# Patient Record
Sex: Male | Born: 1975 | Race: White | Hispanic: No | Smoking: Former smoker
Health system: Southern US, Community
[De-identification: ages and names within clinical notes are randomized; demographics above are authoritative.]

## PROBLEM LIST (undated history)

## (undated) HISTORY — PX: CERVICAL SPINE SURGERY: SHX589

---

## 2000-10-05 ENCOUNTER — Ambulatory Visit (HOSPITAL_COMMUNITY): Admission: RE | Admit: 2000-10-05 | Discharge: 2000-10-05 | Payer: Self-pay | Admitting: Family Medicine

## 2000-10-05 ENCOUNTER — Encounter: Payer: Self-pay | Admitting: Family Medicine

## 2002-09-23 ENCOUNTER — Ambulatory Visit (HOSPITAL_COMMUNITY): Admission: RE | Admit: 2002-09-23 | Discharge: 2002-09-23 | Payer: Self-pay | Admitting: Family Medicine

## 2002-09-23 ENCOUNTER — Encounter: Payer: Self-pay | Admitting: Family Medicine

## 2004-02-11 ENCOUNTER — Emergency Department (HOSPITAL_COMMUNITY): Admission: EM | Admit: 2004-02-11 | Discharge: 2004-02-11 | Payer: Self-pay | Admitting: Emergency Medicine

## 2005-03-28 ENCOUNTER — Ambulatory Visit (HOSPITAL_COMMUNITY): Admission: RE | Admit: 2005-03-28 | Discharge: 2005-03-28 | Payer: Self-pay | Admitting: Family Medicine

## 2005-05-12 ENCOUNTER — Ambulatory Visit (HOSPITAL_COMMUNITY): Admission: RE | Admit: 2005-05-12 | Discharge: 2005-05-12 | Payer: Self-pay | Admitting: Family Medicine

## 2005-05-14 ENCOUNTER — Ambulatory Visit (HOSPITAL_COMMUNITY): Admission: RE | Admit: 2005-05-14 | Discharge: 2005-05-14 | Payer: Self-pay | Admitting: Family Medicine

## 2005-05-29 ENCOUNTER — Ambulatory Visit (HOSPITAL_COMMUNITY): Admission: RE | Admit: 2005-05-29 | Discharge: 2005-05-29 | Payer: Self-pay | Admitting: Family Medicine

## 2005-06-12 ENCOUNTER — Ambulatory Visit: Payer: Self-pay | Admitting: Orthopedic Surgery

## 2006-11-27 ENCOUNTER — Ambulatory Visit (HOSPITAL_COMMUNITY): Admission: RE | Admit: 2006-11-27 | Discharge: 2006-11-27 | Payer: Self-pay | Admitting: Family Medicine

## 2007-05-17 ENCOUNTER — Ambulatory Visit (HOSPITAL_COMMUNITY): Admission: RE | Admit: 2007-05-17 | Discharge: 2007-05-17 | Payer: Self-pay | Admitting: Family Medicine

## 2010-08-11 ENCOUNTER — Emergency Department (HOSPITAL_COMMUNITY)
Admission: EM | Admit: 2010-08-11 | Discharge: 2010-08-11 | Disposition: A | Discharge status: Home / Self Care | Payer: 59 | Attending: Emergency Medicine | Admitting: Emergency Medicine

## 2010-08-11 DIAGNOSIS — Y92009 Unspecified place in unspecified non-institutional (private) residence as the place of occurrence of the external cause: Secondary | ICD-10-CM | POA: Insufficient documentation

## 2010-08-11 DIAGNOSIS — T1590XA Foreign body on external eye, part unspecified, unspecified eye, initial encounter: Secondary | ICD-10-CM | POA: Insufficient documentation

## 2010-08-11 DIAGNOSIS — H5710 Ocular pain, unspecified eye: Secondary | ICD-10-CM | POA: Insufficient documentation

## 2010-08-11 DIAGNOSIS — H538 Other visual disturbances: Secondary | ICD-10-CM | POA: Insufficient documentation

## 2010-08-11 DIAGNOSIS — S0510XA Contusion of eyeball and orbital tissues, unspecified eye, initial encounter: Secondary | ICD-10-CM | POA: Insufficient documentation

## 2010-08-11 DIAGNOSIS — H5789 Other specified disorders of eye and adnexa: Secondary | ICD-10-CM | POA: Insufficient documentation

## 2014-05-23 ENCOUNTER — Encounter: Payer: Self-pay | Admitting: Family Medicine

## 2014-05-23 ENCOUNTER — Ambulatory Visit (INDEPENDENT_AMBULATORY_CARE_PROVIDER_SITE_OTHER): Payer: BC Managed Care – PPO | Admitting: Family Medicine

## 2014-05-23 VITALS — BP 136/78 | Ht 72.0 in | Wt 157.8 lb

## 2014-05-23 DIAGNOSIS — R208 Other disturbances of skin sensation: Secondary | ICD-10-CM

## 2014-05-23 DIAGNOSIS — M4712 Other spondylosis with myelopathy, cervical region: Secondary | ICD-10-CM

## 2014-05-23 DIAGNOSIS — R200 Anesthesia of skin: Secondary | ICD-10-CM

## 2014-05-23 DIAGNOSIS — R29898 Other symptoms and signs involving the musculoskeletal system: Secondary | ICD-10-CM

## 2014-05-23 MED ORDER — PREDNISONE 20 MG PO TABS: ORAL_TABLET | ORAL | Status: DC

## 2014-05-23 MED ORDER — OXYCODONE-ACETAMINOPHEN 5-325 MG PO TABS: 1.0000 | ORAL_TABLET | ORAL | Status: DC | PRN

## 2014-05-23 NOTE — Progress Notes (Signed)
   Subjective:    Patient ID: Andrew Fernandez, male    DOB: [birth year], 38 y.o.   MRN: [id number]  HPI  Patient arrives with complaint of flare of neck pain for one week. Patient also having right hip pain. Patient relates severe neck pain present for the past week present into the trapezius down into the right arm he also relates bilateral numbness of the hands He also relates low back pain with right hip pain as well as left leg weakness and foot drop causing difficulty when he walks  He denies any injury denies previous history of this progressive nature he is also relates difficulty with function of his hands and coordination. Review of Systems See above    Objective:   Physical Exam trapezius pain on the right side patient rates pain as 10+ Reflexes arms and legs are good diminished in the left leg Patient has significant weakness with foot drop and difficulty with eversion ankle movement. Has weakness significant. Slight quadriceps weakness Negative straight leg raise Complains of bilateral hand numbness. 25 minutes spent with patient    Assessment & Plan:  [phone]-patient's phone number Consultation over the phone was held with neurologist. Discussing the aspects of his case it is clear that he has impingement within the neck was seen significant pain discomfort down the right arm plus also could be contributed to bilateral hand numbness as well as weakness of the left leg.  Because of the dramatic nature of the changes it is recommended to pursue for with MRI. Patient does not want MRI until Monday. Prednisone taper. Percocet for pain caution drowsiness home use only  Recommend MRI of the cervical spine we will do that this coming Monday. May need neurology referral for EMG.

## 2014-05-24 ENCOUNTER — Telehealth: Payer: Self-pay | Admitting: Family Medicine

## 2014-05-24 MED ORDER — OXYCODONE-ACETAMINOPHEN 10-325 MG PO TABS: 1.0000 | ORAL_TABLET | ORAL | Status: DC | PRN

## 2014-05-24 NOTE — Telephone Encounter (Signed)
Percocet 10 mg/325 one every 4 hours when necessary pain, #60. Do not combine it with the 5 mg.

## 2014-05-24 NOTE — Telephone Encounter (Signed)
Patient was seen yesterday for neck pain.  He says that the prednisone that he was prescribed yesterday does not seem to be working.  He says that between yesterday and today, he has taken 5 with no help. Please advise.  Welda

## 2014-05-24 NOTE — Telephone Encounter (Signed)
Left VM stating we have the medication waiting up front to be picked up.

## 2014-05-24 NOTE — Telephone Encounter (Signed)
NTC- discuss with pt, this message makes no sense, prednisone will reduce inflammation, percocet for pain , please figure out whats up

## 2014-05-24 NOTE — Telephone Encounter (Signed)
Patient states he meant to say Percocet not prednisone. He states the 5/325 is not working and he wants the 10/325. Please call him at [phone] when script is ready for pickup.

## 2014-05-26 ENCOUNTER — Other Ambulatory Visit: Payer: Self-pay

## 2014-05-26 DIAGNOSIS — M542 Cervicalgia: Secondary | ICD-10-CM

## 2014-05-29 ENCOUNTER — Other Ambulatory Visit (HOSPITAL_COMMUNITY): Payer: 59

## 2014-05-29 ENCOUNTER — Ambulatory Visit (HOSPITAL_COMMUNITY)
Admission: RE | Admit: 2014-05-29 | Discharge: 2014-05-29 | Disposition: A | Discharge status: Home / Self Care | Payer: BC Managed Care – PPO | Source: Ambulatory Visit | Attending: Family Medicine | Admitting: Family Medicine

## 2014-05-29 DIAGNOSIS — R200 Anesthesia of skin: Secondary | ICD-10-CM | POA: Diagnosis not present

## 2014-05-29 DIAGNOSIS — M542 Cervicalgia: Secondary | ICD-10-CM | POA: Diagnosis present

## 2014-05-29 DIAGNOSIS — M4802 Spinal stenosis, cervical region: Secondary | ICD-10-CM | POA: Diagnosis not present

## 2014-05-29 DIAGNOSIS — M5022 Other cervical disc displacement, mid-cervical region: Secondary | ICD-10-CM | POA: Insufficient documentation

## 2014-05-30 ENCOUNTER — Telehealth: Payer: Self-pay | Admitting: *Deleted

## 2014-05-30 DIAGNOSIS — M542 Cervicalgia: Secondary | ICD-10-CM

## 2014-05-30 NOTE — Telephone Encounter (Signed)
Dr. Nicki Reaper would like to speak to patient regarding his MRI results.

## 2014-05-30 NOTE — Telephone Encounter (Signed)
Pt was spoken with, will see specialist in the am

## 2014-05-30 NOTE — Addendum Note (Signed)
Addended by: Dairl Ponder on: 05/30/2014 02:06 PM   Modules accepted: Orders

## 2014-06-01 ENCOUNTER — Telehealth: Payer: Self-pay | Admitting: Family Medicine

## 2014-06-01 NOTE — Telephone Encounter (Signed)
Thank you :)

## 2014-06-01 NOTE — Telephone Encounter (Signed)
FYI - Patient called to let us know he was seen today by Dr. Carloyn Manner (neurosurgeon in [place]) and his neck surgery is scheduled for 07/10/2014

## 2014-06-09 ENCOUNTER — Other Ambulatory Visit: Payer: Self-pay | Admitting: Family Medicine

## 2014-06-09 MED ORDER — OXYCODONE-ACETAMINOPHEN 10-325 MG PO TABS: 1.0000 | ORAL_TABLET | ORAL | Status: DC | PRN

## 2014-06-09 NOTE — Telephone Encounter (Signed)
Patient needs Rx for oxyCODONE-acetaminophen (PERCOCET) 10-325 MG per tablet

## 2014-06-09 NOTE — Telephone Encounter (Signed)
Please give the patient a refill on this medicine

## 2014-06-09 NOTE — Telephone Encounter (Signed)
Last seen 05/23/14

## 2014-06-09 NOTE — Telephone Encounter (Signed)
Left message on voicemail of cell # that script is ready for pickup.

## 2014-07-04 ENCOUNTER — Other Ambulatory Visit: Payer: Self-pay | Admitting: Family Medicine

## 2014-08-10 ENCOUNTER — Other Ambulatory Visit: Payer: Self-pay | Admitting: Family Medicine

## 2014-08-10 NOTE — Telephone Encounter (Signed)
Last seen 05/23/14

## 2015-01-08 ENCOUNTER — Encounter: Payer: Self-pay | Admitting: Family Medicine

## 2015-01-08 ENCOUNTER — Ambulatory Visit (INDEPENDENT_AMBULATORY_CARE_PROVIDER_SITE_OTHER): Payer: BLUE CROSS/BLUE SHIELD | Admitting: Family Medicine

## 2015-01-08 VITALS — BP 112/74 | Ht 72.0 in | Wt 160.0 lb

## 2015-01-08 DIAGNOSIS — N522 Drug-induced erectile dysfunction: Secondary | ICD-10-CM

## 2015-01-08 DIAGNOSIS — F411 Generalized anxiety disorder: Secondary | ICD-10-CM

## 2015-01-08 MED ORDER — SILDENAFIL CITRATE 100 MG PO TABS: 50.0000 mg | ORAL_TABLET | Freq: Every day | ORAL | Status: DC | PRN

## 2015-01-08 MED ORDER — CITALOPRAM HYDROBROMIDE 20 MG PO TABS: ORAL_TABLET | ORAL | Status: DC

## 2015-01-08 NOTE — Progress Notes (Signed)
   Subjective:    Patient ID: Andrew Fernandez, male    DOB: 03/31/1976, 39 y.o.   MRN: [id number]  HPIpt ran out of celexa 20mg . Pt was taking one half tablet instead of one to make them last longer. Has been out of med for the past month. Pt wanting to start back on celexa.  Pt concerned about med causing ED.  He does state that the medication causes erectile dysfunction. When he stops taking the medicine he has trouble with feeling anxious   Review of Systems He denies being depressed denies being suicidal states energy level doing well    Objective:   Physical Exam  Lungs clear hearts regular pulse normal BP good      Assessment & Plan:  Erectile dysfunction-prescription for Viagra given. If he cannot afford that then generic sildenafil through [place] can be done patient will let us know  Generalized anxiety does well with citalopram continue this. 20 mg daily. We did discuss other options but other options have increased risk of side effects for which he states he does not one to do currently  As long as patient does good and has no setbacks follow-up in one year, the importance of keeping healthy exercising watching diet and checking cholesterol discussed. This apparently is done through the [place]

## 2015-01-10 ENCOUNTER — Telehealth: Payer: Self-pay | Admitting: Family Medicine

## 2015-01-10 NOTE — Telephone Encounter (Signed)
Please print a prescription for sildenafil 20 mg, #50, 6 refills, instructions-take 2 to 5 capsules before relations prn, I will sign the prescription and give you a flyer to go with the prescription for the patient to pick up

## 2015-01-10 NOTE — Telephone Encounter (Signed)
Patient says the sildenafil (VIAGRA) 100 MG tablet is too pricey and wants the information to the place in [place] that Dr. Nicki Reaper mentioned.

## 2015-01-11 MED ORDER — SILDENAFIL CITRATE 20 MG PO TABS: ORAL_TABLET | ORAL | Status: DC

## 2015-01-11 NOTE — Telephone Encounter (Signed)
Spoke with patient and informed him that prescription and requested information was ready for pickup. Patient verbalized understanding.

## 2015-01-11 NOTE — Telephone Encounter (Signed)
Prescription printed and ready for pick up with flyer to place in [place] that patient requested.

## 2016-04-03 ENCOUNTER — Ambulatory Visit (INDEPENDENT_AMBULATORY_CARE_PROVIDER_SITE_OTHER): Payer: BLUE CROSS/BLUE SHIELD | Admitting: Nurse Practitioner

## 2016-04-03 ENCOUNTER — Encounter: Payer: Self-pay | Admitting: Nurse Practitioner

## 2016-04-03 VITALS — BP 128/82 | Ht 72.0 in | Wt 157.0 lb

## 2016-04-03 DIAGNOSIS — R7309 Other abnormal glucose: Secondary | ICD-10-CM

## 2016-04-03 LAB — POCT GLYCOSYLATED HEMOGLOBIN (HGB A1C): HEMOGLOBIN A1C: 5.5

## 2016-04-04 ENCOUNTER — Encounter: Payer: Self-pay | Admitting: Nurse Practitioner

## 2016-04-04 NOTE — Progress Notes (Signed)
Subjective:  Presents for a BP check. Blood pressure was elevated on his recent wellness exam at work. Does have some sodium in his diet since he eats out frequently. No edema. No chest pain/ischemic type pain or shortness of breath. Smokes one and a half pack per day. No oral tobacco use. [facility] his father has hypertension.  Objective:   BP 128/82   Ht 6' (1.829 m)   Wt 157 lb (71.2 kg)   BMI 21.29 kg/m  NAD. Alert, oriented. Lungs clear. Heart regular rate rhythm. No murmur or gallop noted. Carotids no bruits or thrills. BP on recheck left arm sitting 118/88 and right arm 120/82. Lower extremities no edema. Nonfasting blood sugar on his recent lab work was 120. Results for orders placed or performed in visit on 04/03/16  POCT glycosylated hemoglobin (Hb A1C)  Result Value Ref Range   Hemoglobin A1C 5.5      Assessment: Elevated glucose - Plan: POCT glycosylated hemoglobin (Hb A1C)  Plan: No medication indicated at this time. Also A1c is normal. Strongly recommend patient begin cutting back on smoking. Our goal is for him to get down to no more than 5 cigarettes per day eventually, ideally for him to quit smoking. Discussed the effect of smoking on his blood pressure. Encouraged low-sodium diet. Recheck here if further problems.

## 2016-06-26 ENCOUNTER — Ambulatory Visit (INDEPENDENT_AMBULATORY_CARE_PROVIDER_SITE_OTHER): Payer: BLUE CROSS/BLUE SHIELD | Admitting: Nurse Practitioner

## 2016-06-26 ENCOUNTER — Encounter: Payer: Self-pay | Admitting: Nurse Practitioner

## 2016-06-26 VITALS — BP 132/86 | Ht 72.0 in | Wt 161.0 lb

## 2016-06-26 DIAGNOSIS — R030 Elevated blood-pressure reading, without diagnosis of hypertension: Secondary | ICD-10-CM | POA: Diagnosis not present

## 2016-06-26 NOTE — Progress Notes (Signed)
Subjective:  Presents to discuss his recent health risk assessment survey done through employer for his insurance. Currently smokes 1 1/2 ppd.   Objective:   BP 132/86   Ht 6' (1.829 m)   Wt 161 lb (73 kg)   BMI 21.84 kg/m  NAD. Alert, oriented. Lungs clear. Heart RRR. Paperwork today shows BP 158/91 and HDL 25. Note that his labs are non fasting.   Assessment: Elevated blood pressure reading  Plan: reduce or stop smoking; goal is for him to cut back to 1/2 ppd over time; recheck BP through [facility] every 3 months as required by insurance; call back if BP is consistently over 140/90. Explained that glucose, TG and LDL are not accurate since non fasting results. Follow up here as needed. No medication indicated at this time.

## 2019-11-16 ENCOUNTER — Encounter: Payer: Self-pay | Admitting: Family Medicine

## 2019-11-16 ENCOUNTER — Other Ambulatory Visit: Payer: Self-pay

## 2019-11-16 ENCOUNTER — Telehealth (INDEPENDENT_AMBULATORY_CARE_PROVIDER_SITE_OTHER): Payer: Commercial Managed Care - PPO | Admitting: Family Medicine

## 2019-11-16 ENCOUNTER — Telehealth: Payer: Self-pay | Admitting: *Deleted

## 2019-11-16 VITALS — Ht 73.0 in

## 2019-11-16 DIAGNOSIS — Z87891 Personal history of nicotine dependence: Secondary | ICD-10-CM

## 2019-11-16 DIAGNOSIS — N529 Male erectile dysfunction, unspecified: Secondary | ICD-10-CM

## 2019-11-16 HISTORY — DX: Personal history of nicotine dependence: Z87.891

## 2019-11-16 HISTORY — DX: Male erectile dysfunction, unspecified: N52.9

## 2019-11-16 MED ORDER — SILDENAFIL CITRATE 20 MG PO TABS: ORAL_TABLET | ORAL | 6 refills | Status: DC

## 2019-11-16 NOTE — Progress Notes (Signed)
   Subjective:    Patient ID: Andrew Fernandez, male    DOB: [birth year], 44 y.o.   MRN: [id number]  HPI  Pt needing follow up. Pt last seen in 2018. Pt states he is trying to do better by quitting smoking and drinking water Patient doing a good job eating healthy staying active.  Losing weight.  Quitting smoking. Has erectile dysfunction would like refills of sildenafil. We did discuss the importance of wellness health checkups No family history of prostate cancer or rectal cancer colonoscopy not indicated currently   Virtual Visit via Video Note  I connected with Andrew Fernandez on 11/16/19 at  1:10 PM EDT by a video enabled telemedicine application and verified that I am speaking with the correct person using two identifiers.  Location: Patient: home Provider: office   I discussed the limitations of evaluation and management by telemedicine and the availability of in person appointments. The patient expressed understanding and agreed to proceed.  History of Present Illness:    Observations/Objective:   Assessment and Plan:   Follow Up Instructions:    I discussed the assessment and treatment plan with the patient. The patient was provided an opportunity to ask questions and all were answered. The patient agreed with the plan and demonstrated an understanding of the instructions.   The patient was advised to call back or seek an in-person evaluation if the symptoms worsen or if the condition fails to improve as anticipated.  I provided 20 minutes of non-face-to-face time during this encounter.   Review of Systems     Objective:   Physical Exam   Today's visit was via telephone Physical exam was not possible for this visit And statin     Assessment & Plan:  We did discuss colonoscopy for age 56 he will look into see if his insurance company covers it  Also screening lipid at med [facility]  Patient quit smoking  I do recommend a wellness exam later this  year  Erectile dysfunction renewal of his prescription was sent he states the sildenafil does a good job of keeping his erectile dysfunction working better

## 2019-11-16 NOTE — Telephone Encounter (Signed)
Mr. reshard, kittelson are scheduled for a virtual visit with your provider today.    Just as we do with appointments in the office, we must obtain your consent to participate.  Your consent will be active for this visit and any virtual visit you may have with one of our providers in the next 365 days.    If you have a MyChart account, I can also send a copy of this consent to you electronically.  All virtual visits are billed to your insurance company just like a traditional visit in the office.  As this is a virtual visit, video technology does not allow for your provider to perform a traditional examination.  This may limit your provider's ability to fully assess your condition.  If your provider identifies any concerns that need to be evaluated in person or the need to arrange testing such as labs, EKG, etc, we will make arrangements to do so.    Although advances in technology are sophisticated, we cannot ensure that it will always work on either your end or our end.  If the connection with a video visit is poor, we may have to switch to a telephone visit.  With either a video or telephone visit, we are not always able to ensure that we have a secure connection.   I need to obtain your verbal consent now.   Are you willing to proceed with your visit today?   XYON DAFFERN has provided verbal consent on 11/16/2019 for a virtual visit (video or telephone).   Patsy Lager, LPN [id number]  [id number] AM

## 2020-01-05 ENCOUNTER — Other Ambulatory Visit (HOSPITAL_COMMUNITY): Payer: Self-pay | Admitting: Neurosurgery

## 2020-01-05 ENCOUNTER — Other Ambulatory Visit: Payer: Self-pay | Admitting: Neurosurgery

## 2020-01-05 DIAGNOSIS — M5412 Radiculopathy, cervical region: Secondary | ICD-10-CM

## 2020-01-16 ENCOUNTER — Ambulatory Visit (HOSPITAL_COMMUNITY)
Admission: RE | Admit: 2020-01-16 | Discharge: 2020-01-16 | Disposition: A | Discharge status: Home / Self Care | Payer: Commercial Managed Care - PPO | Source: Ambulatory Visit | Attending: Neurosurgery | Admitting: Neurosurgery

## 2020-01-16 ENCOUNTER — Other Ambulatory Visit: Payer: Self-pay

## 2020-01-16 DIAGNOSIS — M5412 Radiculopathy, cervical region: Secondary | ICD-10-CM | POA: Insufficient documentation

## 2020-11-23 ENCOUNTER — Other Ambulatory Visit: Payer: Self-pay | Admitting: Family Medicine

## 2020-11-24 NOTE — Telephone Encounter (Signed)
May have this +1 refill needs a follow-up visit in the summer

## 2020-12-31 ENCOUNTER — Ambulatory Visit (INDEPENDENT_AMBULATORY_CARE_PROVIDER_SITE_OTHER): Payer: Commercial Managed Care - PPO | Admitting: Family Medicine

## 2020-12-31 ENCOUNTER — Other Ambulatory Visit: Payer: Self-pay

## 2020-12-31 VITALS — BP 124/87 | HR 88 | Temp 98.1°F | Ht 73.0 in | Wt 163.0 lb

## 2020-12-31 DIAGNOSIS — Z1211 Encounter for screening for malignant neoplasm of colon: Secondary | ICD-10-CM

## 2020-12-31 DIAGNOSIS — E785 Hyperlipidemia, unspecified: Secondary | ICD-10-CM

## 2020-12-31 DIAGNOSIS — Z0001 Encounter for general adult medical examination with abnormal findings: Secondary | ICD-10-CM

## 2020-12-31 DIAGNOSIS — Z0000 Encounter for general adult medical examination without abnormal findings: Secondary | ICD-10-CM

## 2020-12-31 MED ORDER — TADALAFIL 20 MG PO TABS: ORAL_TABLET | ORAL | 3 refills | Status: DC
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
## 2020-12-31 NOTE — Patient Instructions (Signed)
High Cholesterol  High cholesterol is a condition in which the blood has high levels of a white, waxy substance similar to fat (cholesterol). The liver makes all the cholesterol that the body needs. The human body needs small amounts of cholesterol to help build cells. A person gets extra orexcess cholesterol from the food that he or she eats. The blood carries cholesterol from the liver to the rest of the body. If you have high cholesterol, deposits (plaques) may build up on the walls of your arteries. Arteries are the blood vessels that carry blood away from your heart. These plaques make the arteries narrowand stiff. Cholesterol plaques increase your risk for heart attack and stroke. Work withyour health care provider to keep your cholesterol levels in a healthy range. What increases the risk? The following factors may make you more likely to develop this condition: Eating foods that are high in animal fat (saturated fat) or cholesterol. Being overweight. Not getting enough exercise. A family history of high cholesterol (familial hypercholesterolemia). Use of tobacco products. Having diabetes. What are the signs or symptoms? There are no symptoms of this condition. How is this diagnosed? This condition may be diagnosed based on the results of a blood test. If you are older than 46 years of age, your health care provider may check your cholesterol levels every 4-6 years. You may be checked more often if you have high cholesterol or other risk factors for heart disease. The blood test for cholesterol measures: "Bad" cholesterol, or LDL cholesterol. This is the main type of cholesterol that causes heart disease. The desired level is less than 100 mg/dL. "Good" cholesterol, or HDL cholesterol. HDL helps protect against heart disease by cleaning the arteries and carrying the LDL to the liver for processing. The desired level for HDL is 60 mg/dL or higher. Triglycerides. These are fats that your  body can store or burn for energy. The desired level is less than 150 mg/dL. Total cholesterol. This measures the total amount of cholesterol in your blood and includes LDL, HDL, and triglycerides. The desired level is less than 200 mg/dL. How is this treated? This condition may be treated with: Diet changes. You may be asked to eat foods that have more fiber and less saturated fats or added sugar. Lifestyle changes. These may include regular exercise, maintaining a healthy weight, and quitting use of tobacco products. Medicines. These are given when diet and lifestyle changes have not worked. You may be prescribed a statin medicine to help lower your cholesterol levels. Follow these instructions at home: Eating and drinking  Eat a healthy, balanced diet. This diet includes: Daily servings of a variety of fresh, frozen, or canned fruits and vegetables. Daily servings of whole grain foods that are rich in fiber. Foods that are low in saturated fats and trans fats. These include poultry and fish without skin, lean cuts of meat, and low-fat dairy products. A variety of fish, especially oily fish that contain omega-3 fatty acids. Aim to eat fish at least 2 times a week. Avoid foods and drinks that have added sugar. Use healthy cooking methods, such as roasting, grilling, broiling, baking, poaching, steaming, and stir-frying. Do not fry your food except for stir-frying.  Lifestyle  Get regular exercise. Aim to exercise for a total of 150 minutes a week. Increase your activity level by doing activities such as gardening, walking, and taking the stairs. Do not use any products that contain nicotine or tobacco, such as cigarettes, e-cigarettes, and chewing tobacco.   If you need help quitting, ask your health care provider.  General instructions Take over-the-counter and prescription medicines only as told by your health care provider. Keep all follow-up visits as told by your health care provider.  This is important. Where to find more information American Heart Association: www.heart.org National Heart, Lung, and Blood Institute: https://wilson-eaton.com/ Contact a health care provider if: You have trouble achieving or maintaining a healthy diet or weight. You are starting an exercise program. You are unable to stop smoking. Get help right away if: You have chest pain. You have trouble breathing. You have any symptoms of a stroke. "BE FAST" is an easy way to remember the main warning signs of a stroke: B - Balance. Signs are dizziness, sudden trouble walking, or loss of balance. E - Eyes. Signs are trouble seeing or a sudden change in vision. F - Face. Signs are sudden weakness or numbness of the face, or the face or eyelid drooping on one side. A - Arms. Signs are weakness or numbness in an arm. This happens suddenly and usually on one side of the body. S - Speech. Signs are sudden trouble speaking, slurred speech, or trouble understanding what people say. T - Time. Time to call emergency services. Write down what time symptoms started. You have other signs of a stroke, such as: A sudden, severe headache with no known cause. Nausea or vomiting. Seizure. These symptoms may represent a serious problem that is an emergency. Do not wait to see if the symptoms will go away. Get medical help right away. Call your local emergency services (911 in the [place] Do not drive yourself to the hospital. Summary Cholesterol plaques increase your risk for heart attack and stroke. Work with your health care provider to keep your cholesterol levels in a healthy range. Eat a healthy, balanced diet, get regular exercise, and maintain a healthy weight. Do not use any products that contain nicotine or tobacco, such as cigarettes, e-cigarettes, and chewing tobacco. Get help right away if you have any symptoms of a stroke. This information is not intended to replace advice given to you by your health care  provider. Make sure you discuss any questions you have with your healthcare provider. Document Revised: 05/09/2019 Document Reviewed: 05/09/2019 Elsevier Patient Education  [place]. [email or web address]">  DASH Eating Plan DASH stands for Dietary Approaches to Stop Hypertension. The DASH eating plan is a healthy eating plan that has been shown to: Reduce high blood pressure (hypertension). Reduce your risk for type 2 diabetes, heart disease, and stroke. Help with weight loss. What are tips for following this plan? Reading food labels Check food labels for the amount of salt (sodium) per serving. Choose foods with less than 5 percent of the Daily Value of sodium. Generally, foods with less than 300 milligrams (mg) of sodium per serving fit into this eating plan. To find whole grains, look for the word "whole" as the first word in the ingredient list. Shopping Buy products labeled as "low-sodium" or "no salt added." Buy fresh foods. Avoid canned foods and pre-made or frozen meals. Cooking Avoid adding salt when cooking. Use salt-free seasonings or herbs instead of table salt or sea salt. Check with your health care provider or pharmacist before using salt substitutes. Do not fry foods. Cook foods using healthy methods such as baking, boiling, grilling, roasting, and broiling instead. Cook with heart-healthy oils, such as olive, canola, avocado, soybean, or sunflower oil. Meal planning  Eat a balanced diet that includes:  4 or more servings of fruits and 4 or more servings of vegetables each day. Try to fill one-half of your plate with fruits and vegetables. 6-8 servings of whole grains each day. Less than 6 oz (170 g) of lean meat, poultry, or fish each day. A 3-oz (85-g) serving of meat is about the same size as a deck of cards. One egg equals 1 oz (28 g). 2-3 servings of low-fat dairy each day. One serving is 1 cup (237 mL). 1 serving  of nuts, seeds, or beans 5 times each week. 2-3 servings of heart-healthy fats. Healthy fats called omega-3 fatty acids are found in foods such as walnuts, flaxseeds, fortified milks, and eggs. These fats are also found in cold-water fish, such as sardines, salmon, and mackerel. Limit how much you eat of: Canned or prepackaged foods. Food that is high in trans fat, such as some fried foods. Food that is high in saturated fat, such as fatty meat. Desserts and other sweets, sugary drinks, and other foods with added sugar. Full-fat dairy products. Do not salt foods before eating. Do not eat more than 4 egg yolks a week. Try to eat at least 2 vegetarian meals a week. Eat more home-cooked food and less restaurant, buffet, and fast food.  Lifestyle When eating at a restaurant, ask that your food be prepared with less salt or no salt, if possible. If you drink alcohol: Limit how much you use to: 0-1 drink a day for women who are not pregnant. 0-2 drinks a day for men. Be aware of how much alcohol is in your drink. In the [place] one drink equals one 12 oz bottle of beer (355 mL), one 5 oz glass of wine (148 mL), or one 1 oz glass of hard liquor (44 mL). General information Avoid eating more than 2,300 mg of salt a day. If you have hypertension, you may need to reduce your sodium intake to 1,500 mg a day. Work with your health care provider to maintain a healthy body weight or to lose weight. Ask what an ideal weight is for you. Get at least 30 minutes of exercise that causes your heart to beat faster (aerobic exercise) most days of the week. Activities may include walking, swimming, or biking. Work with your health care provider or dietitian to adjust your eating plan to your individual calorie needs. What foods should I eat? Fruits All fresh, dried, or frozen fruit. Canned fruit in natural juice (without addedsugar). Vegetables Fresh or frozen vegetables (raw, steamed, roasted, or grilled).  Low-sodium or reduced-sodium tomato and vegetable juice. Low-sodium or reduced-sodium tomatosauce and tomato paste. Low-sodium or reduced-sodium canned vegetables. Grains Whole-grain or whole-wheat bread. Whole-grain or whole-wheat pasta. Brown rice. [place]. Bulgur. Whole-grain and low-sodium cereals. Pita bread.Low-fat, low-sodium crackers. Whole-wheat flour tortillas. Meats and other proteins Skinless chicken or [place]. Ground chicken or [place]. Pork with fat trimmed off. Fish and seafood. Egg whites. Dried beans, peas, or lentils. Unsalted nuts, nut butters, and seeds. Unsalted canned beans. Lean cuts of beef with fat trimmed off. Low-sodium, lean precooked or cured meat, such as sausages or meatloaves. Dairy Low-fat (1%) or fat-free (skim) milk. Reduced-fat, low-fat, or fat-free cheeses. Nonfat, low-sodium ricotta or cottage cheese. Low-fat or nonfatyogurt. Low-fat, low-sodium cheese. Fats and oils Soft margarine without trans fats. Vegetable oil. Reduced-fat, low-fat, or light mayonnaise and salad dressings (reduced-sodium). Canola, safflower, olive, avocado, soybean, andsunflower oils. Avocado. Seasonings and condiments Herbs. Spices. Seasoning mixes without salt. Other foods Unsalted popcorn and  pretzels. Fat-free sweets. The items listed above may not be a complete list of foods and beverages you can eat. Contact a dietitian for more information. What foods should I avoid? Fruits Canned fruit in a light or heavy syrup. Fried fruit. Fruit in cream or buttersauce. Vegetables Creamed or fried vegetables. Vegetables in a cheese sauce. Regular canned vegetables (not low-sodium or reduced-sodium). Regular canned tomato sauce and paste (not low-sodium or reduced-sodium). Regular tomato and vegetable juice(not low-sodium or reduced-sodium). Angie Fava. Olives. Grains Baked goods made with fat, such as croissants, muffins, or some breads. Drypasta or rice meal packs. Meats and other  proteins Fatty cuts of meat. Ribs. Fried meat. Berniece Salines. Bologna, salami, and other precooked or cured meats, such as sausages or meat loaves. Fat from the back of a pig (fatback). Bratwurst. Salted nuts and seeds. Canned beans with added salt. Canned orsmoked fish. Whole eggs or egg yolks. Chicken or [place] with skin. Dairy Whole or 2% milk, cream, and half-and-half. Whole or full-fat cream cheese. Whole-fat or sweetened yogurt. Full-fat cheese. Nondairy creamers. Whippedtoppings. Processed cheese and cheese spreads. Fats and oils Butter. Stick margarine. Lard. Shortening. Ghee. Bacon fat. Tropical oils, suchas coconut, palm kernel, or palm oil. Seasonings and condiments Onion salt, garlic salt, seasoned salt, table salt, and sea salt. Worcestershire sauce. Tartar sauce. Barbecue sauce. Teriyaki sauce. Soy sauce, including reduced-sodium. Steak sauce. Canned and packaged gravies. Fish sauce. Oyster sauce. Cocktail sauce. Store-bought horseradish. Ketchup. Mustard. Meat flavorings and tenderizers. Bouillon cubes. Hot sauces. Pre-made or packaged marinades. Pre-made or packaged taco seasonings. Relishes. Regular saladdressings. Other foods Salted popcorn and pretzels. The items listed above may not be a complete list of foods and beverages you should avoid. Contact a dietitian for more information. Where to find more information National Heart, Lung, and Blood Institute: https://wilson-eaton.com/ American Heart Association: www.heart.org Academy of Nutrition and Dietetics: www.eatright.[place]: www.kidney.org Summary The DASH eating plan is a healthy eating plan that has been shown to reduce high blood pressure (hypertension). It may also reduce your risk for type 2 diabetes, heart disease, and stroke. When on the DASH eating plan, aim to eat more fresh fruits and vegetables, whole grains, lean proteins, low-fat dairy, and heart-healthy fats. With the DASH eating plan, you should limit  salt (sodium) intake to 2,300 mg a day. If you have hypertension, you may need to reduce your sodium intake to 1,500 mg a day. Work with your health care provider or dietitian to adjust your eating plan to your individual calorie needs. This information is not intended to replace advice given to you by your health care provider. Make sure you discuss any questions you have with your healthcare provider. Document Revised: 05/13/2019 Document Reviewed: 05/13/2019 Elsevier Patient Education  2022 [employer].

## 2021-01-02 ENCOUNTER — Encounter (INDEPENDENT_AMBULATORY_CARE_PROVIDER_SITE_OTHER): Payer: Self-pay | Admitting: *Deleted

## 2021-04-12 ENCOUNTER — Encounter (INDEPENDENT_AMBULATORY_CARE_PROVIDER_SITE_OTHER): Payer: Self-pay

## 2021-04-12 ENCOUNTER — Telehealth (INDEPENDENT_AMBULATORY_CARE_PROVIDER_SITE_OTHER): Payer: Self-pay

## 2021-04-12 ENCOUNTER — Other Ambulatory Visit (INDEPENDENT_AMBULATORY_CARE_PROVIDER_SITE_OTHER): Payer: Self-pay

## 2021-04-12 DIAGNOSIS — Z1211 Encounter for screening for malignant neoplasm of colon: Secondary | ICD-10-CM

## 2021-04-12 MED ORDER — PEG 3350-KCL-NA BICARB-NACL 420 G PO SOLR: 4000.0000 mL | ORAL | 0 refills | Status: DC

## 2021-04-12 NOTE — Telephone Encounter (Signed)
Andrew Fernandez, CMA  

## 2021-04-12 NOTE — Telephone Encounter (Signed)
Referring MD/PCP: Luking  Procedure: TCS  Reason/Indication:  Screening  Has patient had this procedure before?  no  If so, when, by whom and where?    Is there a family history of colon cancer?  no  Who?  What age when diagnosed?    Is patient diabetic? If yes, Type 1 or Type 2   no      Does patient have prosthetic heart valve or mechanical valve?  no  Do you have a pacemaker/defibrillator?  no  Has patient ever had endocarditis/atrial fibrillation? no  Does patient use oxygen? no  Has patient had joint replacement within last 12 months?  no  Is patient constipated or do they take laxatives? no  Does patient have a history of alcohol/drug use?  no  Have you had a stroke/heart attack last 6 mths? no  Do you take medicine for weight loss?  no  For male patients,: do you still have your menstrual cycle? N/A  Is patient on blood thinner such as Coumadin, Plavix and/or Aspirin? no  Medications: none  Allergies: nkda  Medication Adjustment per Dr Jenetta Downer none  Procedure date & time: Wednesday 05/08/21 at 9:55 am

## 2021-05-03 ENCOUNTER — Other Ambulatory Visit: Payer: Self-pay

## 2021-05-03 ENCOUNTER — Ambulatory Visit: Payer: Commercial Managed Care - PPO | Admitting: Family Medicine

## 2021-05-03 ENCOUNTER — Encounter: Payer: Self-pay | Admitting: Family Medicine

## 2021-05-03 ENCOUNTER — Ambulatory Visit (INDEPENDENT_AMBULATORY_CARE_PROVIDER_SITE_OTHER): Payer: Commercial Managed Care - PPO | Admitting: Family Medicine

## 2021-05-03 VITALS — BP 122/86 | Temp 98.4°F | Wt 171.8 lb

## 2021-05-03 DIAGNOSIS — E785 Hyperlipidemia, unspecified: Secondary | ICD-10-CM

## 2021-05-03 DIAGNOSIS — Z131 Encounter for screening for diabetes mellitus: Secondary | ICD-10-CM | POA: Diagnosis not present

## 2021-05-03 MED ORDER — SILDENAFIL CITRATE 100 MG PO TABS: 50.0000 mg | ORAL_TABLET | Freq: Every day | ORAL | 11 refills | Status: DC | PRN

## 2021-05-03 NOTE — Progress Notes (Signed)
   Subjective:    Patient ID: Andrew Fernandez, male    DOB: 1975/07/05, 45 y.o.   MRN: [id number]  HPI Pt here for follow up. Pt following up on health maintenance that he had at work. In order to be compliant with insurance he needs 3 follow up.   Patient is trying to follow his diet trying to stay active.  Had slightly elevated cholesterol in the past his workplace requires him to do every 3 to 105-month visits in order to maintain wellness certification Review of Systems     Objective:   Physical Exam  General-in no acute distress Eyes-no discharge Lungs-respiratory rate normal, CTA CV-no murmurs,RRR Extremities skin warm dry no edema Neuro grossly normal Behavior normal, alert       Assessment & Plan:  Hyperlipidemia healthy diet patient is doing good with this regular physical activity in addition to this recommend repeat lipid profile  Follow-up by February  Glucose screening.  Healthy diet recommended

## 2021-05-04 LAB — LIPID PANEL
Chol/HDL Ratio: 7 ratio — ABNORMAL HIGH (ref 0.0–5.0)
Cholesterol, Total: 204 mg/dL — ABNORMAL HIGH (ref 100–199)
HDL: 29 mg/dL — ABNORMAL LOW (ref >39)
LDL Chol Calc (NIH): 130 mg/dL — ABNORMAL HIGH (ref 0–99)
Triglycerides: 248 mg/dL — ABNORMAL HIGH (ref 0–149)
VLDL Cholesterol Cal: 45 mg/dL — ABNORMAL HIGH (ref 5–40)

## 2021-05-04 LAB — GLUCOSE, RANDOM: Glucose: 100 mg/dL — ABNORMAL HIGH (ref 70–99)

## 2021-05-08 ENCOUNTER — Ambulatory Visit (HOSPITAL_COMMUNITY)
Admission: RE | Admit: 2021-05-08 | Discharge: 2021-05-08 | Disposition: A | Discharge status: Home / Self Care | Payer: Commercial Managed Care - PPO | Attending: Gastroenterology | Admitting: Gastroenterology

## 2021-05-08 ENCOUNTER — Encounter (HOSPITAL_COMMUNITY): Payer: Self-pay | Admitting: Gastroenterology

## 2021-05-08 ENCOUNTER — Other Ambulatory Visit: Payer: Self-pay

## 2021-05-08 ENCOUNTER — Ambulatory Visit (HOSPITAL_COMMUNITY): Payer: Commercial Managed Care - PPO | Admitting: Anesthesiology

## 2021-05-08 ENCOUNTER — Encounter (HOSPITAL_COMMUNITY)
Admission: RE | Disposition: A | Discharge status: Home / Self Care | Payer: Self-pay | Source: Home / Self Care | Attending: Gastroenterology

## 2021-05-08 DIAGNOSIS — Z1211 Encounter for screening for malignant neoplasm of colon: Secondary | ICD-10-CM

## 2021-05-08 DIAGNOSIS — N529 Male erectile dysfunction, unspecified: Secondary | ICD-10-CM | POA: Diagnosis not present

## 2021-05-08 DIAGNOSIS — K635 Polyp of colon: Secondary | ICD-10-CM | POA: Diagnosis not present

## 2021-05-08 DIAGNOSIS — D125 Benign neoplasm of sigmoid colon: Secondary | ICD-10-CM

## 2021-05-08 DIAGNOSIS — Z87891 Personal history of nicotine dependence: Secondary | ICD-10-CM | POA: Insufficient documentation

## 2021-05-08 HISTORY — PX: POLYPECTOMY: SHX5525

## 2021-05-08 HISTORY — PX: COLONOSCOPY WITH PROPOFOL: SHX5780

## 2021-05-08 LAB — HM COLONOSCOPY

## 2021-05-08 SURGERY — COLONOSCOPY WITH PROPOFOL
Anesthesia: General

## 2021-05-08 MED ORDER — LACTATED RINGERS IV SOLN
INTRAVENOUS | Status: DC
  Administered 2021-05-08: 1000 mL via INTRAVENOUS

## 2021-05-08 MED ORDER — PROPOFOL 500 MG/50ML IV EMUL
INTRAVENOUS | Status: DC | PRN
  Administered 2021-05-08: 150 ug/kg/min via INTRAVENOUS

## 2021-05-08 MED ORDER — PROPOFOL 10 MG/ML IV BOLUS
INTRAVENOUS | Status: DC | PRN
  Administered 2021-05-08: 100 mg via INTRAVENOUS

## 2021-05-08 NOTE — Anesthesia Postprocedure Evaluation (Signed)
Anesthesia Post Note  Patient: Andrew Fernandez  Procedure(s) Performed: COLONOSCOPY WITH PROPOFOL POLYPECTOMY  Patient location during evaluation: Phase II Anesthesia Type: General Level of consciousness: awake Pain management: pain level controlled Vital Signs Assessment: post-procedure vital signs reviewed and stable Respiratory status: spontaneous breathing and respiratory function stable Cardiovascular status: blood pressure returned to baseline and stable Postop Assessment: no headache and no apparent nausea or vomiting Anesthetic complications: no Comments: Late entry   No notable events documented.   Last Vitals:  Vitals:   05/08/21 1004 05/08/21 1006  BP: (!) 104/52 (!) 105/54  Pulse: 72 78  Resp: 15 16  Temp: 36.6 C   SpO2: 94% 95%    Last Pain:  Vitals:   05/08/21 1006  TempSrc:   PainSc: 0-No pain                 Louann Sjogren

## 2021-05-08 NOTE — Transfer of Care (Signed)
Immediate Anesthesia Transfer of Care Note  Patient: Andrew Fernandez  Procedure(s) Performed: COLONOSCOPY WITH PROPOFOL POLYPECTOMY  Patient Location: Endoscopy Unit  Anesthesia Type:General  Level of Consciousness: drowsy  Airway & Oxygen Therapy: Patient Spontanous Breathing  Post-op Assessment: Report given to RN and Post -op Vital signs reviewed and stable  Post vital signs: Reviewed and stable  Last Vitals:  Vitals Value Taken Time  BP    Temp    Pulse    Resp    SpO2      Last Pain:  Vitals:   05/08/21 0934  TempSrc:   PainSc: 0-No pain      Patients Stated Pain Goal: 5 (09/38/18 2993)  Complications: No notable events documented.

## 2021-05-08 NOTE — Anesthesia Preprocedure Evaluation (Signed)
Anesthesia Evaluation  Patient identified by MRN, date of birth, ID band Patient awake    Reviewed: Allergy & Precautions, H&P , NPO status , Patient's Chart, lab work & pertinent test results, reviewed documented beta blocker date and time   Airway Mallampati: II  TM Distance: >3 FB Neck ROM: full    Dental no notable dental hx.    Pulmonary neg pulmonary ROS, former smoker,    Pulmonary exam normal breath sounds clear to auscultation       Cardiovascular Exercise Tolerance: Good negative cardio ROS   Rhythm:regular Rate:Normal     Neuro/Psych negative neurological ROS  negative psych ROS   GI/Hepatic negative GI ROS, Neg liver ROS,   Endo/Other  negative endocrine ROS  Renal/GU negative Renal ROS  negative genitourinary   Musculoskeletal   Abdominal   Peds  Hematology negative hematology ROS (+)   Anesthesia Other Findings   Reproductive/Obstetrics negative OB ROS                             Anesthesia Physical Anesthesia Plan  ASA: 2  Anesthesia Plan: General   Post-op Pain Management:    Induction:   PONV Risk Score and Plan: Propofol infusion  Airway Management Planned:   Additional Equipment:   Intra-op Plan:   Post-operative Plan:   Informed Consent: I have reviewed the patients History and Physical, chart, labs and discussed the procedure including the risks, benefits and alternatives for the proposed anesthesia with the patient or authorized representative who has indicated his/her understanding and acceptance.     Dental Advisory Given  Plan Discussed with: CRNA  Anesthesia Plan Comments:         Anesthesia Quick Evaluation  

## 2021-05-08 NOTE — Op Note (Signed)
[facility] Patient Name: Andrew Fernandez Procedure Date: 05/08/2021 9:08 AM MRN: [id number] Date of Birth: 19-May-1976 Attending MD: Maylon Peppers ,  CSN: [id number] Age: 45 Admit Type: Outpatient Procedure:                Colonoscopy Indications:              Screening for colorectal malignant neoplasm Providers:                Maylon Peppers, Rosina Lowenstein, RN, Casimer Bilis, Technician Referring MD:              Medicines:                Monitored Anesthesia Care Complications:            No immediate complications. Estimated Blood Loss:     Estimated blood loss: none. Procedure:                Pre-Anesthesia Assessment:                           - Prior to the procedure, a History and Physical                            was performed, and patient medications, allergies                            and sensitivities were reviewed. The patient's                            tolerance of previous anesthesia was reviewed.                           - The risks and benefits of the procedure and the                            sedation options and risks were discussed with the                            patient. All questions were answered and informed                            consent was obtained.                           - ASA Grade Assessment: I - A normal, healthy                            patient.                           After obtaining informed consent, the colonoscope                            was passed under direct vision. Throughout the  procedure, the patient's blood pressure, pulse, and                            oxygen saturations were monitored continuously. The                            [id number]-HQ190L ([id number]) scope was introduced through                            the anus and advanced to the the cecum, identified                            by appendiceal orifice and ileocecal valve. The                             colonoscopy was performed without difficulty. The                            patient tolerated the procedure well. The quality                            of the bowel preparation was adequate. Scope In: 9:40:01 AM Scope Out: 10:01:30 AM Scope Withdrawal Time: 0 hours 14 minutes 36 seconds  Total Procedure Duration: 0 hours 21 minutes 29 seconds  Findings:      The perianal and digital rectal examinations were normal.      A 4 mm polyp was found in the sigmoid colon. The polyp was sessile. The       polyp was removed with a cold snare. Resection was complete, but the       polyp tissue was not retrieved.      The retroflexed view of the distal rectum and anal verge was normal and       showed no anal or rectal abnormalities. Impression:               - One 4 mm polyp in the sigmoid colon, removed with                            a cold snare. Complete resection. Polyp tissue not                            retrieved.                           - The distal rectum and anal verge are normal on                            retroflexion view. Moderate Sedation:      Per Anesthesia Care Recommendation:           - Discharge patient to home (ambulatory).                           - Resume previous diet.                           -  Repeat colonoscopy in 7 years for screening                            purposes. Procedure Code(s):        --- Professional ---                           [phone], Colonoscopy, flexible; with removal of                            tumor(s), polyp(s), or other lesion(s) by snare                            technique Diagnosis Code(s):        --- Professional ---                           Z12.11, Encounter for screening for malignant                            neoplasm of colon                           K63.5, Polyp of colon CPT copyright 2019 American Medical Association. All rights reserved. The codes documented in this report are preliminary and upon coder  review may  be revised to meet current compliance requirements. Maylon Peppers, MD Maylon Peppers,  05/08/2021 10:05:06 AM This report has been signed electronically. Number of Addenda: 0

## 2021-05-08 NOTE — Discharge Instructions (Signed)
You are being discharged to home.  Resume your previous diet.  Your physician has recommended a repeat colonoscopy in 10 years for screening purposes.  

## 2021-05-08 NOTE — H&P (Signed)
Andrew Fernandez is an 45 y.o. male.   Chief Complaint: screening colonoscopy HPI: 45 y/o M with PMH of erectile dysfunction, coming for screening colonoscopy. The patient has never had a colonoscopy in the past.  The patient denies having any complaints such as melena, hematochezia, abdominal pain or distention, change in her bowel movement consistency or frequency, no changes in her weight recently.  No family history of colorectal cancer.   Past Medical History:  Diagnosis Date   Erectile dysfunction 11/16/2019   Former smoker 11/16/2019   Quit March 2021    Past Surgical History:  Procedure Laterality Date   CERVICAL SPINE SURGERY      No family history on file. Social History:  reports that he quit smoking about 20 months ago. His smoking use included cigarettes. He has never used smokeless tobacco. He reports that he does not use drugs. No history on file for alcohol use.  Allergies: Not on File  Medications Prior to Admission  Medication Sig Dispense Refill   polyethylene glycol-electrolytes (TRILYTE) 420 g solution Take 4,000 mLs by mouth as directed. 4000 mL 0   sildenafil (VIAGRA) 100 MG tablet Take 0.5-1 tablets (50-100 mg total) by mouth daily as needed for erectile dysfunction. 10 tablet 11   tadalafil (CIALIS) 20 MG tablet Take one tablet before relations prn 5 tablet 3    No results found for this or any previous visit (from the past 48 hour(s)). No results found.  Review of Systems  Constitutional: Negative.   HENT: Negative.    Eyes: Negative.   Respiratory: Negative.    Cardiovascular: Negative.   Gastrointestinal: Negative.   Endocrine: Negative.   Genitourinary: Negative.   Musculoskeletal: Negative.   Skin: Negative.   Allergic/Immunologic: Negative.   Neurological: Negative.   Hematological: Negative.   Psychiatric/Behavioral: Negative.     Blood pressure 128/84, pulse 76, temperature 97.9 F (36.6 C), temperature source Oral, resp. rate 14, height  6\' 1"  (1.854 m), weight 77.6 kg, SpO2 98 %. Physical Exam  GENERAL: The patient is AO x3, in no acute distress. HEENT: Head is normocephalic and atraumatic. EOMI are intact. Mouth is well hydrated and without lesions. NECK: Supple. No masses LUNGS: Clear to auscultation. No presence of rhonchi/wheezing/rales. Adequate chest expansion HEART: RRR, normal s1 and s2. ABDOMEN: Soft, nontender, no guarding, no peritoneal signs, and nondistended. BS +. No masses. EXTREMITIES: Without any cyanosis, clubbing, rash, lesions or edema. NEUROLOGIC: AOx3, no focal motor deficit. SKIN: no jaundice, no rashes]  Assessment/Plan 45 y/o M with PMH of erectile dysfunction, coming for screening colonoscopy. The patient is at average risk for colorectal cancer.  We will proceed with colonoscopy today.   Andrew Quale, MD 05/08/2021, 9:26 AM

## 2021-05-09 ENCOUNTER — Encounter (INDEPENDENT_AMBULATORY_CARE_PROVIDER_SITE_OTHER): Payer: Self-pay | Admitting: *Deleted

## 2021-05-10 ENCOUNTER — Encounter (HOSPITAL_COMMUNITY): Payer: Self-pay | Admitting: Gastroenterology

## 2021-07-17 ENCOUNTER — Ambulatory Visit
Admission: EM | Admit: 2021-07-17 | Discharge: 2021-07-17 | Disposition: A | Discharge status: Home / Self Care | Payer: Commercial Managed Care - PPO | Attending: Family Medicine | Admitting: Family Medicine

## 2021-07-17 ENCOUNTER — Encounter: Payer: Self-pay | Admitting: Emergency Medicine

## 2021-07-17 ENCOUNTER — Other Ambulatory Visit: Payer: Self-pay

## 2021-07-17 DIAGNOSIS — R319 Hematuria, unspecified: Secondary | ICD-10-CM | POA: Insufficient documentation

## 2021-07-17 DIAGNOSIS — R350 Frequency of micturition: Secondary | ICD-10-CM | POA: Diagnosis not present

## 2021-07-17 LAB — POCT URINALYSIS DIP (MANUAL ENTRY)
Bilirubin, UA: NEGATIVE
Glucose, UA: NEGATIVE mg/dL
Ketones, POC UA: NEGATIVE mg/dL
Leukocytes, UA: NEGATIVE
Nitrite, UA: NEGATIVE
Spec Grav, UA: 1.015 (ref 1.010–1.025)
Urobilinogen, UA: 1 E.U./dL
pH, UA: 6 (ref 5.0–8.0)

## 2021-07-17 MED ORDER — CEPHALEXIN 500 MG PO CAPS: 500.0000 mg | ORAL_CAPSULE | Freq: Two times a day (BID) | ORAL | 0 refills | Status: DC

## 2021-07-17 NOTE — ED Triage Notes (Addendum)
Pt reports urinary urgency for last several days. Pt reports tea colored urine at 430pm and reports voided PTA and states urine is now clear. Pt denies any other symptoms.

## 2021-07-19 LAB — URINE CULTURE: Culture: NO GROWTH

## 2021-07-20 NOTE — ED Provider Notes (Signed)
°  [facility]    ASSESSMENT & PLAN:  1. Hematuria, unspecified type   2. Urinary frequency    With smoking history discussed importance of urology f/u; may discuss with PCP. With urinary frequency will tx empirically for infection. Urine culture pending. Begin: Meds ordered this encounter  Medications   cephALEXin (KEFLEX) 500 MG capsule    Sig: Take 1 capsule (500 mg total) by mouth 2 (two) times daily.    Dispense:  14 capsule    Refill:  0    Follow-up Information     Kathyrn Drown, MD.   Specialty: Family Medicine Why: If worsening or failing to improve as anticipated. Contact information: [address] [place] [address] [phone]                 Outlined signs and symptoms indicating need for more acute intervention. Patient verbalized understanding. After Visit Summary given.  SUBJECTIVE:  Andrew Fernandez is a 46 y.o. male who complains of urinary frequency; noted today; dark tea to red-colored urine. Afebrile. No h/o similar. No penile discharge. No tx PTA. No abd/back pain.  Social History   Tobacco Use  Smoking Status Former   Types: Cigarettes   Quit date: 09/06/2019   Years since quitting: 1.8  Smokeless Tobacco Never    OBJECTIVE:  Vitals:   07/17/21 1833 07/17/21 1835  BP:  (!) 151/84  Pulse:  85  Resp:  18  Temp:  98 F (36.7 C)  TempSrc:  Oral  SpO2:  97%  Weight: 77.1 kg   Height: 6\' 1"  (1.854 m)    General appearance: alert; no distress Skin: warm and dry Psychological: alert and cooperative; normal mood and affect  Labs Reviewed  POCT URINALYSIS DIP (MANUAL ENTRY) - Abnormal; Notable for the following components:      Result Value   Color, UA red (*)    Blood, UA large (*)    Protein Ur, POC trace (*)    All other components within normal limits  URINE CULTURE    No Known Allergies  Past Medical History:  Diagnosis Date   Erectile dysfunction 11/16/2019   Former smoker 11/16/2019    Quit March 2021   Social History   Socioeconomic History   Marital status: Married    Spouse name: Not on file   Number of children: Not on file   Years of education: Not on file   Highest education level: Not on file  Occupational History   Not on file  Tobacco Use   Smoking status: Former    Types: Cigarettes    Quit date: 09/06/2019    Years since quitting: 1.8   Smokeless tobacco: Never  Vaping Use   Vaping Use: Never used  Substance and Sexual Activity   Alcohol use: Not on file    Comment: very rare   Drug use: Never   Sexual activity: Not on file  Other Topics Concern   Not on file  Social History Narrative   Not on file   Social Determinants of Health   Financial Resource Strain: Not on file  Food Insecurity: Not on file  Transportation Needs: Not on file  Physical Activity: Not on file  Stress: Not on file  Social Connections: Not on file  Intimate Partner Violence: Not on file   History reviewed. No pertinent family history.      Vanessa Kick, MD 07/20/21 [phone]

## 2021-08-02 ENCOUNTER — Ambulatory Visit (INDEPENDENT_AMBULATORY_CARE_PROVIDER_SITE_OTHER): Payer: Commercial Managed Care - PPO | Admitting: Family Medicine

## 2021-08-02 ENCOUNTER — Other Ambulatory Visit: Payer: Self-pay

## 2021-08-02 VITALS — BP 124/85 | HR 76 | Temp 98.1°F | Ht 73.0 in | Wt 172.0 lb

## 2021-08-02 DIAGNOSIS — E785 Hyperlipidemia, unspecified: Secondary | ICD-10-CM

## 2021-08-02 DIAGNOSIS — I700 Atherosclerosis of aorta: Secondary | ICD-10-CM | POA: Diagnosis not present

## 2021-08-02 MED ORDER — ROSUVASTATIN CALCIUM 10 MG PO TABS: 10.0000 mg | ORAL_TABLET | Freq: Every day | ORAL | 1 refills | Status: DC

## 2021-08-02 NOTE — Patient Instructions (Signed)

## 2021-08-02 NOTE — Progress Notes (Signed)
° °  Subjective:    Patient ID: Andrew Fernandez, male    DOB: 1976-04-17, 46 y.o.   MRN: [id number]  HPI Follow up for wellness check- required by employer for BP, cholesterol  Letter needed saying was addressed today  Patient here today follow-up on blood pressure Also here for follow-up regarding cholesterol Tries to stay active with his job Tries to eat healthy Does play golf Denies any particular setbacks recently   Review of Systems     Objective:   Physical Exam The 10-year ASCVD risk score (Arnett DK, et al., 2019) is: 10.8%   Values used to calculate the score:     Age: 46 years     Sex: Male     Is Non-Hispanic African American: No     Diabetic: No     Tobacco smoker: Yes     Systolic Blood Pressure: 941 mmHg     Is BP treated: No     HDL Cholesterol: 29 mg/dL     Total Cholesterol: 204 mg/dL  This is based upon previous cholesterol profile.  Based upon this patient is at increased risk for heart disease Also CT scan that was completed when he was having kidney stones did show aortic atherosclerosis  Lungs clear heart regular pulse normal BP good    Assessment & Plan:  1. Hyperlipidemia, unspecified hyperlipidemia type Recommend Crestor 10 mg daily. Side effects discussed, to follow-up in approximately 5 to 6 months for wellness  - CT CARDIAC SCORING (SELF PAY ONLY) - Lipid panel - Hepatic function panel  2. Aortic atherosclerosis ([place]) This was seen on CAT scan Healthy diet regular activity Patient does not smoke  - CT CARDIAC SCORING (SELF PAY ONLY) - Lipid panel - Hepatic function panel

## 2021-08-03 LAB — HEPATIC FUNCTION PANEL
ALT: 10 IU/L (ref 0–44)
AST: 19 IU/L (ref 0–40)
Albumin: 4.8 g/dL (ref 4.0–5.0)
Alkaline Phosphatase: 56 IU/L (ref 44–121)
Bilirubin Total: 0.2 mg/dL (ref 0.0–1.2)
Bilirubin, Direct: <0.1 mg/dL (ref 0.00–0.40)
Total Protein: 7 g/dL (ref 6.0–8.5)

## 2021-08-03 LAB — LIPID PANEL
Chol/HDL Ratio: 6.4 ratio — ABNORMAL HIGH (ref 0.0–5.0)
Cholesterol, Total: 204 mg/dL — ABNORMAL HIGH (ref 100–199)
HDL: 32 mg/dL — ABNORMAL LOW (ref >39)
LDL Chol Calc (NIH): 137 mg/dL — ABNORMAL HIGH (ref 0–99)
Triglycerides: 192 mg/dL — ABNORMAL HIGH (ref 0–149)
VLDL Cholesterol Cal: 35 mg/dL (ref 5–40)

## 2021-08-05 ENCOUNTER — Other Ambulatory Visit: Payer: Self-pay

## 2021-08-05 DIAGNOSIS — E785 Hyperlipidemia, unspecified: Secondary | ICD-10-CM

## 2021-09-13 ENCOUNTER — Other Ambulatory Visit: Payer: Self-pay

## 2021-09-13 ENCOUNTER — Ambulatory Visit (HOSPITAL_COMMUNITY)
Admission: RE | Admit: 2021-09-13 | Discharge: 2021-09-13 | Disposition: A | Discharge status: Home / Self Care | Payer: Self-pay | Source: Ambulatory Visit | Attending: Family Medicine | Admitting: Family Medicine

## 2021-09-13 DIAGNOSIS — I700 Atherosclerosis of aorta: Secondary | ICD-10-CM | POA: Insufficient documentation

## 2021-09-13 DIAGNOSIS — E785 Hyperlipidemia, unspecified: Secondary | ICD-10-CM | POA: Insufficient documentation

## 2021-10-31 ENCOUNTER — Other Ambulatory Visit: Payer: Self-pay | Admitting: Family Medicine

## 2022-02-06 ENCOUNTER — Other Ambulatory Visit: Payer: Self-pay

## 2022-02-06 ENCOUNTER — Telehealth: Payer: Self-pay | Admitting: Family Medicine

## 2022-02-06 DIAGNOSIS — Z131 Encounter for screening for diabetes mellitus: Secondary | ICD-10-CM

## 2022-02-06 DIAGNOSIS — E785 Hyperlipidemia, unspecified: Secondary | ICD-10-CM

## 2022-02-06 DIAGNOSIS — Z125 Encounter for screening for malignant neoplasm of prostate: Secondary | ICD-10-CM

## 2022-02-06 NOTE — Telephone Encounter (Signed)
Left message for patient to return the call for additional details and recommendations.   

## 2022-02-06 NOTE — Telephone Encounter (Signed)
Patient has physical on 8/30 and needing labs done

## 2022-02-06 NOTE — Telephone Encounter (Signed)
Lipid, liver, metabolic 7, PSA Hyperlipidemia wellness screening prostate cancer

## 2022-02-06 NOTE — Telephone Encounter (Signed)
Lab request for upcoming appt on 01/2022. Most recent lab 07/2021 lipid, liver

## 2022-02-07 NOTE — Telephone Encounter (Signed)
Patient made aware.

## 2022-02-15 LAB — LIPID PANEL
Chol/HDL Ratio: 8 ratio — ABNORMAL HIGH (ref 0.0–5.0)
Cholesterol, Total: 216 mg/dL — ABNORMAL HIGH (ref 100–199)
HDL: 27 mg/dL — ABNORMAL LOW (ref >39)
LDL Chol Calc (NIH): 146 mg/dL — ABNORMAL HIGH (ref 0–99)
Triglycerides: 235 mg/dL — ABNORMAL HIGH (ref 0–149)
VLDL Cholesterol Cal: 43 mg/dL — ABNORMAL HIGH (ref 5–40)

## 2022-02-15 LAB — HEPATIC FUNCTION PANEL
ALT: 16 IU/L (ref 0–44)
AST: 23 IU/L (ref 0–40)
Albumin: 4.6 g/dL (ref 4.1–5.1)
Alkaline Phosphatase: 49 IU/L (ref 44–121)
Bilirubin Total: 0.5 mg/dL (ref 0.0–1.2)
Bilirubin, Direct: 0.14 mg/dL (ref 0.00–0.40)
Total Protein: 6.7 g/dL (ref 6.0–8.5)

## 2022-02-15 LAB — BASIC METABOLIC PANEL
BUN/Creatinine Ratio: 13 (ref 9–20)
BUN: 12 mg/dL (ref 6–24)
CO2: 20 mmol/L (ref 20–29)
Calcium: 9.3 mg/dL (ref 8.7–10.2)
Chloride: 104 mmol/L (ref 96–106)
Creatinine, Ser: 0.92 mg/dL (ref 0.76–1.27)
Glucose: 90 mg/dL (ref 70–99)
Potassium: 4.4 mmol/L (ref 3.5–5.2)
Sodium: 138 mmol/L (ref 134–144)
eGFR: 103 mL/min/{1.73_m2} (ref >59)

## 2022-02-15 LAB — PSA: Prostate Specific Ag, Serum: 0.5 ng/mL (ref 0.0–4.0)

## 2022-02-19 ENCOUNTER — Encounter: Payer: Self-pay | Admitting: Family Medicine

## 2022-02-19 ENCOUNTER — Ambulatory Visit (INDEPENDENT_AMBULATORY_CARE_PROVIDER_SITE_OTHER): Payer: Commercial Managed Care - PPO | Admitting: Family Medicine

## 2022-02-19 VITALS — BP 119/84 | Ht 73.0 in | Wt 174.0 lb

## 2022-02-19 DIAGNOSIS — Z0000 Encounter for general adult medical examination without abnormal findings: Secondary | ICD-10-CM | POA: Diagnosis not present

## 2022-02-19 DIAGNOSIS — E785 Hyperlipidemia, unspecified: Secondary | ICD-10-CM

## 2022-02-19 MED ORDER — ROSUVASTATIN CALCIUM 10 MG PO TABS: ORAL_TABLET | ORAL | 3 refills | Status: DC

## 2022-02-19 NOTE — Progress Notes (Signed)
   Subjective:    Patient ID: Andrew Fernandez, male    DOB: Apr 20, 1976, 46 y.o.   MRN: [id number]  HPI  The patient comes in today for a wellness visit. Patient overall doing well Trying to eat healthy Stays physically active Very busy with his job Does not smoke  We did discuss healthy diet as well as stress reduction techniques patient denies depression  A review of their health history was completed.  A review of medications was also completed.  Any needed refills; yes  Eating habits: eating fair  Falls/  MVA accidents in past few months: none  Regular exercise: no  Specialist pt sees on regular basis: no  Preventative health issues were discussed.   Additional concerns: not sleeping well   Review of Systems     Objective:   Physical Exam  General-in no acute distress Eyes-no discharge Lungs-respiratory rate normal, CTA CV-no murmurs,RRR Extremities skin warm dry no edema Neuro grossly normal Behavior normal, alert  The 10-year ASCVD risk score (Arnett DK, et al., 2019) is: 12.5%   Values used to calculate the score:     Age: 58 years     Sex: Male     Is Non-Hispanic African American: No     Diabetic: No     Tobacco smoker: Yes     Systolic Blood Pressure: 176 mmHg     Is BP treated: No     HDL Cholesterol: 27 mg/dL     Total Cholesterol: 216 mg/dL      Assessment & Plan:  Overall patient doing well Up-to-date on colonoscopy BP good Prostate exam not indicated Does have hyperlipidemia but when on medicine his LDL looks good  Adult wellness-complete.wellness physical was conducted today. Importance of diet and exercise were discussed in detail.  Importance of stress reduction and healthy living were discussed.  In addition to this a discussion regarding safety was also covered.  We also reviewed over immunizations and gave recommendations regarding current immunization needed for age.   In addition to this additional areas were also touched  on including: Preventative health exams needed:  Colonoscopy 2023  Patient was advised yearly wellness exam Blood pressure is acceptable healthy diet minimize salt regular physical activity recommended Prostate exam not indicated Follow-up in 1 year His cholesterol was elevated but when I looked at his cholesterol on the blood work he brought from his job it was under very good control, the difference was he ran out of medicine, he will restart his medicine and let us know if he is having any trouble

## 2022-07-16 ENCOUNTER — Other Ambulatory Visit: Payer: Self-pay | Admitting: Family Medicine

## 2023-01-28 ENCOUNTER — Telehealth: Payer: Self-pay | Admitting: Family Medicine

## 2023-01-28 DIAGNOSIS — Z79899 Other long term (current) drug therapy: Secondary | ICD-10-CM

## 2023-01-28 DIAGNOSIS — Z114 Encounter for screening for human immunodeficiency virus [HIV]: Secondary | ICD-10-CM

## 2023-01-28 DIAGNOSIS — Z125 Encounter for screening for malignant neoplasm of prostate: Secondary | ICD-10-CM

## 2023-01-28 DIAGNOSIS — Z1159 Encounter for screening for other viral diseases: Secondary | ICD-10-CM

## 2023-01-28 DIAGNOSIS — Z131 Encounter for screening for diabetes mellitus: Secondary | ICD-10-CM

## 2023-01-28 DIAGNOSIS — Z0000 Encounter for general adult medical examination without abnormal findings: Secondary | ICD-10-CM

## 2023-01-28 DIAGNOSIS — E785 Hyperlipidemia, unspecified: Secondary | ICD-10-CM

## 2023-01-28 NOTE — Telephone Encounter (Signed)
Lipid, liver, metabolic 7, PSA, CBC, HIV antibody, hepatitis C antibody Diagnosis-screening hepatitis C and HIV per CDC guideline Wellness High risk medication Hyperlipidemia

## 2023-01-28 NOTE — Telephone Encounter (Signed)
Patient has physical 9/19 and needing labs

## 2023-01-29 NOTE — Telephone Encounter (Signed)
See My chart message

## 2023-03-12 ENCOUNTER — Ambulatory Visit: Payer: Commercial Managed Care - PPO | Admitting: Family Medicine

## 2023-03-12 VITALS — BP 136/78 | HR 74 | Temp 97.9°F | Ht 73.0 in | Wt 172.4 lb

## 2023-03-12 DIAGNOSIS — F439 Reaction to severe stress, unspecified: Secondary | ICD-10-CM | POA: Diagnosis not present

## 2023-03-12 DIAGNOSIS — Z0001 Encounter for general adult medical examination with abnormal findings: Secondary | ICD-10-CM

## 2023-03-12 DIAGNOSIS — E785 Hyperlipidemia, unspecified: Secondary | ICD-10-CM

## 2023-03-12 DIAGNOSIS — Z0000 Encounter for general adult medical examination without abnormal findings: Secondary | ICD-10-CM

## 2023-03-12 DIAGNOSIS — I700 Atherosclerosis of aorta: Secondary | ICD-10-CM | POA: Diagnosis not present

## 2023-03-12 MED ORDER — ROSUVASTATIN CALCIUM 20 MG PO TABS: ORAL_TABLET | ORAL | 5 refills | Status: DC

## 2023-03-12 MED ORDER — SERTRALINE HCL 50 MG PO TABS: 50.0000 mg | ORAL_TABLET | Freq: Every day | ORAL | 5 refills | Status: DC

## 2023-03-12 NOTE — Progress Notes (Signed)
Subjective:    Patient ID: Andrew Fernandez, male    DOB: [birth year], 47 y.o.   MRN: [id number]  HPI The patient comes in today for a wellness visit.    A review of their health history was completed.  A review of medications was also completed.  Any needed refills; no  Eating habits: fair  Falls/  MVA accidents in past few months: no  Regular exercise: no  Specialist pt sees on regular basis: no  Preventative health issues were discussed.   Additional concerns: anxiety and depression     03/12/2023   10:04 AM 02/19/2022    1:25 PM 12/31/2020    1:52 PM 11/16/2019    1:01 PM  Depression screen PHQ 2/9  Decreased Interest 2 0 0 0  Down, Depressed, Hopeless 3 0 0 0  PHQ - 2 Score 5 0 0 0  Altered sleeping 2     Tired, decreased energy 2     Change in appetite 1     Feeling bad or failure about yourself  1     Trouble concentrating 0     Moving slowly or fidgety/restless 0     Suicidal thoughts 0     PHQ-9 Score 11     Difficult doing work/chores Somewhat difficult      Patient very stressed by his job Unrealistic expectations Does not have good mentoring in the job Feeling stressed and 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 Well adult exam Adult wellness-complete.wellness physical was conducted today. Importance of diet and exercise were discussed in detail.  Importance of stress reduction and healthy living were discussed.  In addition to this a discussion regarding safety was also covered.  We also reviewed over immunizations and gave recommendations  regarding current immunization needed for age.   In addition to this additional areas were also touched on including: Preventative health exams needed:  Colonoscopy not due until 2029  Patient was advised yearly wellness exam   2. Stress 1 patient does have some mild depression Not suicidal A lot of this is stress-induced related to very difficult job where he is getting very little mentoring and very little guidance I did encourage him to do stress reduction techniques We also discussed medications versus counseling Patient is open to medications we will try sertraline half tablet daily for the first 7 days then 1 tablet thereafter Side effects discussed if the medication makes him feel worse to quit medicine and notify us and follow-up ASAP If that makes him feel worse or depressed to stop the medicine Give [place] feedback within 3 weeks and follow-up office visit within 6  Ultimately he may have to consider moving away from this job  3. Aortic atherosclerosis ([facility]) Importance of getting cholesterol under control coronary calcium looked good on previous test in 2023    4. Hyperlipidemia, unspecified hyperlipidemia type We did increase the dose of his statin from 10 mg to 20 mg along with healthy eating regular activity   Did discuss proper ways to try to lessen stress Patient is contemplating regarding the job  He will give [place] feedback in 3 weeks Follow-up within 6 weeks

## 2023-04-23 ENCOUNTER — Ambulatory Visit: Payer: Commercial Managed Care - PPO | Admitting: Family Medicine

## 2023-04-29 ENCOUNTER — Encounter: Payer: Self-pay | Admitting: Family Medicine

## 2023-04-29 ENCOUNTER — Other Ambulatory Visit: Payer: Self-pay | Admitting: Family Medicine

## 2023-04-29 MED ORDER — TADALAFIL 20 MG PO TABS
10.0000 mg | ORAL_TABLET | ORAL | 11 refills | Status: DC | PRN
Start: 2023-04-29 — End: 2023-09-18

## 2023-05-20 ENCOUNTER — Ambulatory Visit (INDEPENDENT_AMBULATORY_CARE_PROVIDER_SITE_OTHER): Payer: Commercial Managed Care - PPO | Admitting: Family Medicine

## 2023-05-20 ENCOUNTER — Encounter: Payer: Self-pay | Admitting: Family Medicine

## 2023-05-20 VITALS — BP 115/81 | HR 77 | Temp 98.2°F | Ht 73.0 in | Wt 187.0 lb

## 2023-05-20 DIAGNOSIS — E785 Hyperlipidemia, unspecified: Secondary | ICD-10-CM | POA: Diagnosis not present

## 2023-05-20 DIAGNOSIS — K219 Gastro-esophageal reflux disease without esophagitis: Secondary | ICD-10-CM

## 2023-05-20 MED ORDER — PANTOPRAZOLE SODIUM 40 MG PO TBEC: 40.0000 mg | DELAYED_RELEASE_TABLET | Freq: Every day | ORAL | 3 refills | Status: DC

## 2023-05-20 MED ORDER — SUCRALFATE 1 G PO TABS
1.0000 g | ORAL_TABLET | Freq: Three times a day (TID) | ORAL | 0 refills | Status: AC
Start: 2023-05-20 — End: ?

## 2023-05-20 NOTE — Progress Notes (Signed)
   Subjective:    Patient ID: Andrew Fernandez, male    DOB: 1975/12/27, 47 y.o.   MRN: [id number]  HPI Stress levels much better Job going much better Having frequent reflux where it burns and is esophagus into his throat sometimes wakes up choking or coughing This been going on past few weeks No vomiting no dysphagia    Review of Systems     Objective:   Physical Exam  General-in no acute distress Eyes-no discharge Lungs-respiratory rate normal, CTA CV-no murmurs,RRR Extremities skin warm dry no edema Neuro grossly normal Behavior normal, alert       Assessment & Plan:  GERD Protonix daily Carafate crushed up tablet mixed with small amount of water take 3-4 times daily for the next few weeks Minimize tomato-based foods spicy foods fried foods Head of bed on 6 inch block If not improved in the next 2 weeks notify us Send [place] update within 2 weeks EGD not indicated Does not sound cardiac  Cholesterol patient will do lipid profile await results  Will notify patient of the results Stress levels doing much better with his job if any problems notify us Follow-up no later than September next year for wellness follow-up sooner if any problems

## 2023-05-25 ENCOUNTER — Other Ambulatory Visit: Payer: Self-pay

## 2023-05-25 DIAGNOSIS — Z79899 Other long term (current) drug therapy: Secondary | ICD-10-CM

## 2023-05-25 DIAGNOSIS — E785 Hyperlipidemia, unspecified: Secondary | ICD-10-CM

## 2023-06-13 LAB — HEPATIC FUNCTION PANEL
ALT: 25 [IU]/L (ref 0–44)
AST: 29 [IU]/L (ref 0–40)
Albumin: 4.7 g/dL (ref 4.1–5.1)
Alkaline Phosphatase: 55 [IU]/L (ref 44–121)
Bilirubin Total: <0.2 mg/dL (ref 0.0–1.2)
Bilirubin, Direct: 0.09 mg/dL (ref 0.00–0.40)
Total Protein: 7.1 g/dL (ref 6.0–8.5)

## 2023-06-13 LAB — LIPID PANEL
Chol/HDL Ratio: 3.2 {ratio} (ref 0.0–5.0)
Cholesterol, Total: 118 mg/dL (ref 100–199)
HDL: 37 mg/dL — ABNORMAL LOW (ref >39)
LDL Chol Calc (NIH): 62 mg/dL (ref 0–99)
Triglycerides: 102 mg/dL (ref 0–149)
VLDL Cholesterol Cal: 19 mg/dL (ref 5–40)

## 2023-06-17 IMAGING — CT CT CARDIAC CORONARY ARTERY CALCIUM SCORE
2 of 3 series · 8 of 20 positions shown, 10 images · non-contrast
Comparison: None.
COMPARISON: None.

Addendum:
EXAM:
OVER-READ INTERPRETATION  CT CHEST

The following report is an over-read performed by radiologist Dr.
Birukmame Ewnet [REDACTED] on 09/13/2021. This over-read
does not include interpretation of cardiac or coronary anatomy or
pathology. The coronary calcium score interpretation by the
cardiologist is attached.
TECHNIQUE: A gated, non-contrast computed tomography scan of the heart was
performed using 3mm slice thickness. Axial images were analyzed on a
dedicated workstation. Calcium scoring of the coronary arteries was
performed using the Agatston method.

[Series 2: cascseq 3.0 qr36 70% · axial · 0.69mm/px · z∈[+1333,+1372]mm · 2 of 39 slices shown]
[im 13/39  vessel]
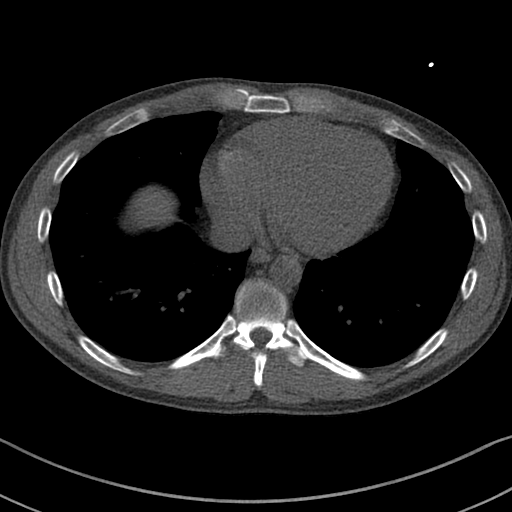
[im 26/39  vessel]
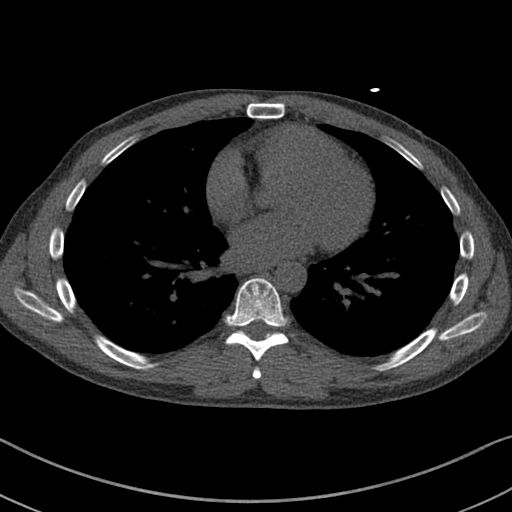

[Series 4: ax lung · axial · 0.69mm/px · z∈[+1312,+1394]mm · 6 of 59 slices shown, 8 images]
[im 9/59  vessel]
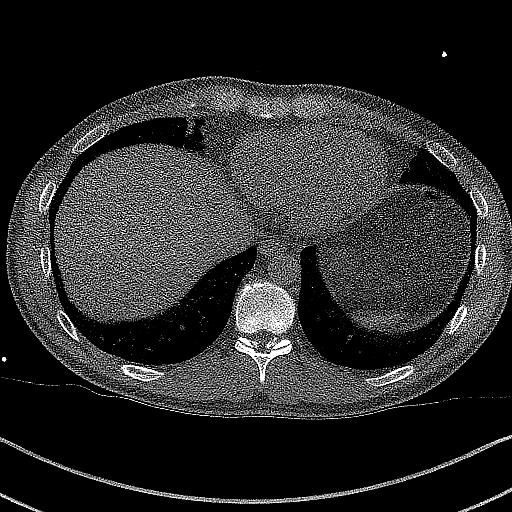
[im 9/59  lung]
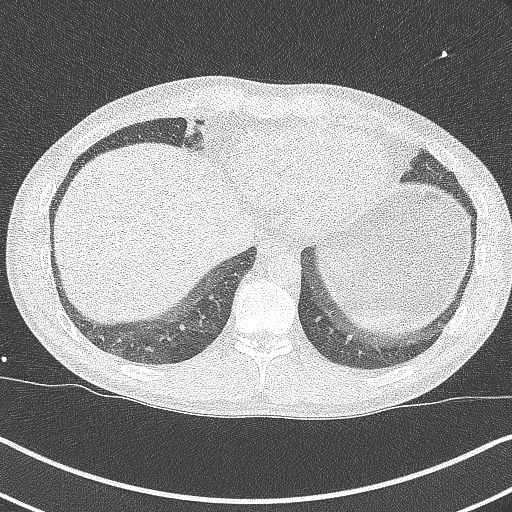
[im 17/59  vessel]
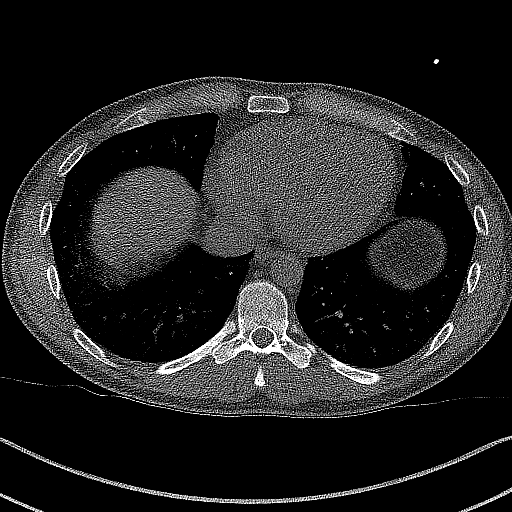
[im 25/59  vessel]
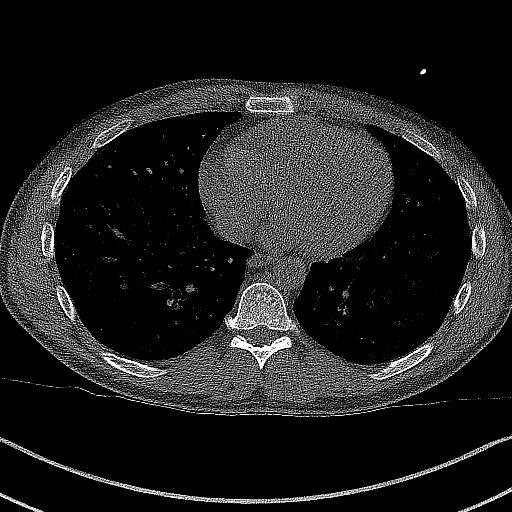
[im 34/59  vessel]
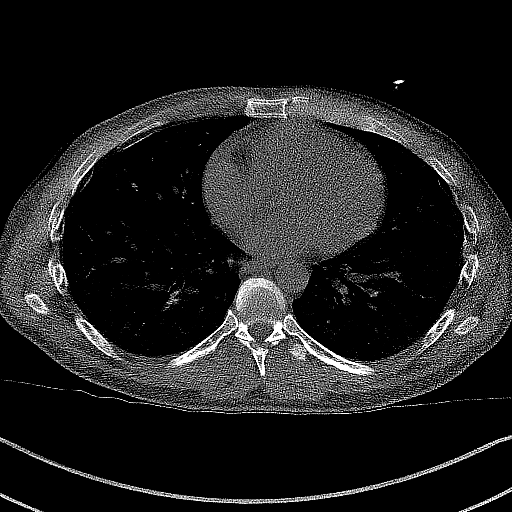
[im 42/59  vessel]
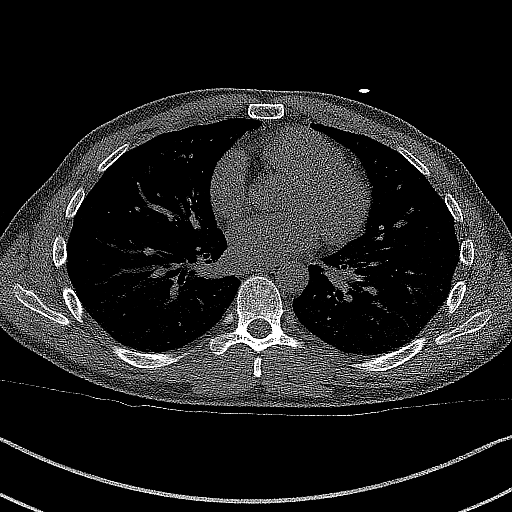
[im 42/59  lung]
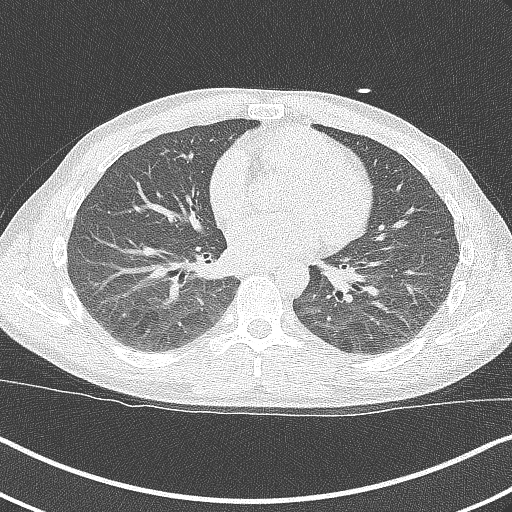
[im 50/59  vessel]
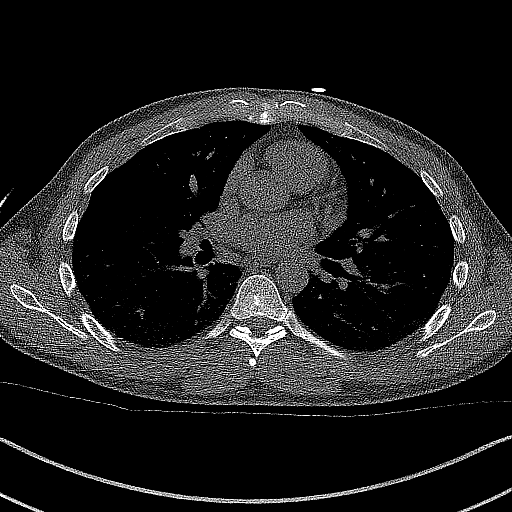

[8 of 20 positions shown; findings below may reference images not displayed]

FINDINGS: Vascular: Heart is normal size.  Aorta normal caliber.

Mediastinum/Nodes: No adenopathy

Lungs/Pleura: No confluent opacities or effusions.

Upper Abdomen: No acute findings

Musculoskeletal: Chest wall soft tissues are unremarkable. No acute
bony abnormality.
IMPRESSION: No acute or significant extracardiac abnormality.

ADDENDUM:
Cardiovascular Disease Risk stratification

EXAM:
Coronary Calcium Score
FINDINGS: Coronary arteries: Normal origins.

Coronary Calcium Score:

Left main: 0

Left anterior descending artery: 0

Left circumflex artery: 0

Right coronary artery: 0

Total: 0

Percentile: 0

Pericardium: Normal.

Ascending Aorta: Normal caliber.

Non-cardiac: See separate report from [REDACTED].
IMPRESSION: Coronary calcium score of 0. This was 0 percentile for age-, race-,
and sex-matched controls.



If CAC=0, it is reasonable to withhold statin therapy and reassess
in 5 to 10 years, as long as higher risk conditions are absent
(diabetes mellitus, family history of premature CHD in first degree
relatives (males <55 years; females <65 years), cigarette smoking,
or LDL >=190 mg/dL).

If CAC is 1 to 99, it is reasonable to initiate statin therapy for
patients >=55 years of age.

If CAC is >=100 or >=75th percentile, it is reasonable to initiate
statin therapy at any age.

Cardiology referral should be considered for patients with CAC
scores >=400 or >=75th percentile.

*2629 AHA/ACC/AACVPR/AAPA/ABC/TIGER/APLAS/TUAZON/Kolta/UMA/OKOYE/TIGER
Guideline on the Management of Blood Cholesterol: A Report of the
American College of Cardiology/American Heart Association Task Force
on Clinical Practice Guidelines. J Am Coll Cardiol.
1257;73(24):7978-7120.

Dajean Bagby, DO [REDACTED]

*** End of Addendum ***
EXAM:
OVER-READ INTERPRETATION  CT CHEST

The following report is an over-read performed by radiologist Dr.
Birukmame Ewnet [REDACTED] on 09/13/2021. This over-read
does not include interpretation of cardiac or coronary anatomy or
pathology. The coronary calcium score interpretation by the
cardiologist is attached.
FINDINGS: Vascular: Heart is normal size.  Aorta normal caliber.

Mediastinum/Nodes: No adenopathy

Lungs/Pleura: No confluent opacities or effusions.

Upper Abdomen: No acute findings

Musculoskeletal: Chest wall soft tissues are unremarkable. No acute
bony abnormality.
IMPRESSION: No acute or significant extracardiac abnormality.

## 2023-09-18 ENCOUNTER — Other Ambulatory Visit: Payer: Self-pay

## 2023-09-18 ENCOUNTER — Encounter: Payer: Self-pay | Admitting: Family Medicine

## 2023-09-18 MED ORDER — PANTOPRAZOLE SODIUM 40 MG PO TBEC: 40.0000 mg | DELAYED_RELEASE_TABLET | Freq: Every day | ORAL | 3 refills | Status: AC

## 2023-09-18 MED ORDER — TADALAFIL 20 MG PO TABS: 10.0000 mg | ORAL_TABLET | ORAL | 11 refills | Status: AC | PRN

## 2024-03-15 ENCOUNTER — Ambulatory Visit: Payer: Self-pay | Admitting: *Deleted

## 2024-03-15 NOTE — Telephone Encounter (Signed)
 Has office visit for tomorrow

## 2024-03-15 NOTE — Telephone Encounter (Signed)
 Copied from CRM [id number]. Topic: Clinical - Red Word Triage >> Mar 15, 2024  8:26 AM Andrew Fernandez wrote: [facility] that prompted transfer to Nurse Triage: Patient has been having lower back pain for about a month and a half. This morning the pain is at a 10. Reason for Disposition  Blood in urine (red, pink, or tea-colored)  Answer Assessment - Initial Assessment Questions 1. ONSET: When did the pain begin? (e.g., minutes, hours, days)     I'm having bad lower back pain.   This morning it's a 10/10.   No injuries.   I've had kidney stones.   It feels a little like that.      2. LOCATION: Where does it hurt? (upper, mid or lower back)     This is more in the middle small of my back.    Occasionally down my right leg and numbness in my fingers on both hands.   3. SEVERITY: How bad is the pain?  (e.g., Scale 1-10; mild, moderate, or severe)     10/10 this morning 4. PATTERN: Is the pain constant? (e.g., yes, no; constant, intermittent)      For the last month it's intermittent.   Today I woke up it's a 10 but tomorrow I can wake up and it not be there at all. 5. RADIATION: Does the pain shoot into your legs or somewhere else?     Yes my right leg 6. CAUSE:  What do you think is causing the back pain?      I'm not sure 7. BACK OVERUSE:  Any recent lifting of heavy objects, strenuous work or exercise?     No 8. MEDICINES: What have you taken so far for the pain? (e.g., nothing, acetaminophen , NSAIDS)     Ibuprofen and Aleve and heating pad 9. NEUROLOGIC SYMPTOMS: Do you have any weakness, numbness, or problems with bowel/bladder control?     Numbness in the fingers of both hands. 10. OTHER SYMPTOMS: Do you have any other symptoms? (e.g., fever, abdomen pain, burning with urination, blood in urine)       Yesterday morning I thought I saw red in my urine.   When I had the kidney stones in 2024 I had bilateral kidney stones.   This morning no blood or discolor. 11. PREGNANCY:  Is there any chance you are pregnant? When was your last menstrual period?       N/A  Protocols used: Back Pain-A-AH FYI Only or Action Required?: FYI only for provider.  Patient was last seen in primary care on 05/20/2023 by Alphonsa Glendia LABOR, MD.  Called Nurse Triage reporting Back Pain.midle lower back pain.  History of kidney stones in 2024.   Symptoms began about a month ago. Intermittent pain varies in intensity in lower back and down right leg.   Also numbness in fingers of both hands.  Interventions attempted: OTC medications: ibuprofen and Aleve.  Symptoms are: gradually worsening.  Triage Disposition: See Physician Within 24 Hours  Patient/caregiver understands and will follow disposition?: Yes

## 2024-03-16 ENCOUNTER — Encounter: Payer: Self-pay | Admitting: Family Medicine

## 2024-03-16 ENCOUNTER — Ambulatory Visit: Admitting: Family Medicine

## 2024-03-16 VITALS — BP 134/82 | HR 79 | Temp 98.1°F | Ht 73.0 in | Wt 174.0 lb

## 2024-03-16 DIAGNOSIS — R202 Paresthesia of skin: Secondary | ICD-10-CM

## 2024-03-16 DIAGNOSIS — Z9289 Personal history of other medical treatment: Secondary | ICD-10-CM

## 2024-03-16 DIAGNOSIS — M5432 Sciatica, left side: Secondary | ICD-10-CM

## 2024-03-16 DIAGNOSIS — Z9889 Other specified postprocedural states: Secondary | ICD-10-CM | POA: Diagnosis not present

## 2024-03-16 DIAGNOSIS — M21372 Foot drop, left foot: Secondary | ICD-10-CM

## 2024-03-16 DIAGNOSIS — M5431 Sciatica, right side: Secondary | ICD-10-CM | POA: Diagnosis not present

## 2024-03-16 DIAGNOSIS — R200 Anesthesia of skin: Secondary | ICD-10-CM

## 2024-03-16 MED ORDER — HYDROCODONE-ACETAMINOPHEN 10-325 MG PO TABS: ORAL_TABLET | ORAL | 0 refills | Status: DC

## 2024-03-16 MED ORDER — PREDNISONE 20 MG PO TABS: ORAL_TABLET | ORAL | 0 refills | Status: DC

## 2024-03-16 NOTE — Progress Notes (Signed)
   Subjective:    Patient ID: Andrew Fernandez, male    DOB: 1975-10-12, 48 y.o.   MRN: [id number]  HPI Lower back pain for one and a half months Pain 10 yesterday, today 7 to 3  Ibuprofen, aleve, heat Patient has had difficult time over the past 6 weeks Started off her low back pain then he started noticing bilateral numbness into his legs on a fairly consistent basis In addition to this he has also noticed that his left foot seems to catch a lot when he walks and he is having to walk differently to avoid tripping He also relates some numbness into his forearms and into his hands bilateral that happened a couple different days He has had 3 previous C-spine surgeries his last 1 was a little over a year ago He denies any weakness in the arms He has not had any falls injuries no fever chills or sweats no rectal bleeding no dysuria  Review of Systems     Objective:   Physical Exam Lungs clear heart regular pulse normal negative straight leg raise bilateral reflexes reasonable patella bilateral he can extend great toe bilateral has more of a difficult time with raising his foot up as well on the left side when I watched him walk in the hallway he clearly swings his left leg out laterally as he walks and picks his whole leg up higher because the foot does not pick up as well  No weakness in the hands.  No sign of atrophy.     Assessment & Plan:  1. Bilateral sciatica (Primary) Given his low back pain and radiation to both legs numbness as well as left foot drop I believe there is a significant impingement in the spinal cord in the lower back perhaps a nerve root I think it is reasonable to pursue forward with doing an MRI of the lumbar spine We will hold off on doing an MRI of the cervical spine.  He will be given a few hydrocodone  that he can use when he is at home caution drowsiness  2. Left foot drop See discussion above Progress forward with MRI  3. History of MRI of cervical  spine He has had 3 previous spine surgeries in his neck but I do not think that this is cervical myelopathy at this point  4. History of back surgery See above  5. Bilateral numbness and tingling of arms and legs This was brief hold off on cervical MRI pursue lumbar MRI

## 2024-03-29 ENCOUNTER — Encounter: Payer: Self-pay | Admitting: Family Medicine

## 2024-03-30 ENCOUNTER — Telehealth: Payer: Self-pay | Admitting: Family Medicine

## 2024-03-30 DIAGNOSIS — M5431 Sciatica, right side: Secondary | ICD-10-CM

## 2024-03-30 DIAGNOSIS — M21372 Foot drop, left foot: Secondary | ICD-10-CM

## 2024-03-30 NOTE — Telephone Encounter (Signed)
 STAT MRI ordered in [facility]- referral coordinator notified

## 2024-03-30 NOTE — Telephone Encounter (Signed)
 Nurses Please order stat MRI lumbar spine-ideally needs to be completed within the next 7 days thank you Reason is bilateral leg numbness Also left foot drop with leg weakness  Please have prior approval team get this approved They can look at the prior documentation from late September Then they can schedule this then notify patient thank you

## 2024-03-31 ENCOUNTER — Ambulatory Visit (HOSPITAL_COMMUNITY)
Admission: RE | Admit: 2024-03-31 | Discharge: 2024-03-31 | Disposition: A | Discharge status: Home / Self Care | Source: Ambulatory Visit | Attending: Family Medicine | Admitting: Family Medicine

## 2024-03-31 DIAGNOSIS — M21372 Foot drop, left foot: Secondary | ICD-10-CM | POA: Insufficient documentation

## 2024-03-31 DIAGNOSIS — M5432 Sciatica, left side: Secondary | ICD-10-CM | POA: Diagnosis present

## 2024-03-31 DIAGNOSIS — M5431 Sciatica, right side: Secondary | ICD-10-CM | POA: Insufficient documentation

## 2024-04-05 ENCOUNTER — Ambulatory Visit: Payer: Self-pay | Admitting: Family Medicine

## 2024-05-16 ENCOUNTER — Other Ambulatory Visit: Payer: Self-pay

## 2024-05-16 ENCOUNTER — Encounter: Payer: Self-pay | Admitting: Family Medicine

## 2024-05-16 DIAGNOSIS — Z9889 Other specified postprocedural states: Secondary | ICD-10-CM

## 2024-05-16 DIAGNOSIS — Z9289 Personal history of other medical treatment: Secondary | ICD-10-CM

## 2024-05-16 DIAGNOSIS — M21372 Foot drop, left foot: Secondary | ICD-10-CM

## 2024-05-16 DIAGNOSIS — M5431 Sciatica, right side: Secondary | ICD-10-CM

## 2024-05-16 DIAGNOSIS — R200 Anesthesia of skin: Secondary | ICD-10-CM

## 2024-05-16 NOTE — Telephone Encounter (Signed)
 Nurses Lets go ahead with consultation [facility] in [place] Due to back pain and sciatica  Patient has already had an MRI Please initiate referral  [facility] will reach out to the patient somewhere in the near future to schedule him an appointment  Thanks-Dr. Glendia

## 2024-06-01 NOTE — Progress Notes (Unsigned)
 Referring Physician:  Alphonsa Glendia LABOR, MD [address] [place],  [place] [address]  Primary Physician:  Alphonsa Glendia LABOR, MD  History of Present Illness: 06/01/2024 Andrew Fernandez is here today with a chief complaint of ***  Low back pain that radiates down bilateral legs causing numbness, tingling as well as left foot drop.  Duration: *** Location: *** Quality: *** Severity: ***  Precipitating: aggravated by *** Modifying factors: made better by *** Weakness: none Timing: *** Bowel/Bladder Dysfunction: none  Conservative measures:  Physical therapy: has not participated in Multimodal medical therapy including regular antiinflammatories:  Hydrocodone , Prednisone  Injections: no epidural steroid injections  Past Surgery: 09/2022 C4-5 ACDF X 3  Jarett Dralle Mortensen has ***no symptoms of cervical myelopathy.  The symptoms are causing a significant impact on the patient's life.   I have utilized the care everywhere function in epic to review the outside records available from external health systems.  Review of Systems:  A 10 point review of systems is negative, except for the pertinent positives and negatives detailed in the HPI.  Past Medical History: Past Medical History:  Diagnosis Date   Erectile dysfunction 11/16/2019   Former smoker 11/16/2019   Quit March 2021    Past Surgical History: Past Surgical History:  Procedure Laterality Date   CERVICAL SPINE SURGERY     COLONOSCOPY WITH PROPOFOL  N/A 05/08/2021   Procedure: COLONOSCOPY WITH PROPOFOL ;  Surgeon: Eartha Angelia Sieving, MD;  [facility]: [facility];  Service: Gastroenterology;  Laterality: N/A;  955   POLYPECTOMY  05/08/2021   Procedure: POLYPECTOMY;  Surgeon: Eartha Angelia Sieving, MD;  Location: [facility];  Service: Gastroenterology;;    Allergies: Allergies as of 06/08/2024   (No Known Allergies)    Medications:  Current Outpatient Medications:     HYDROcodone -acetaminophen  (NORCO) 10-325 MG tablet, 1/2 to 1 q4 prn pain home use only, Disp: 15 tablet, Rfl: 0   pantoprazole  (PROTONIX ) 40 MG tablet, Take 1 tablet (40 mg total) by mouth daily., Disp: 30 tablet, Rfl: 3   predniSONE  (DELTASONE ) 20 MG tablet, 2 every day for 4d then 1qd for 4d, Disp: 12 tablet, Rfl: 0   rosuvastatin  (CRESTOR ) 20 MG tablet, TAKE ONE TABLET BY MOUTH DAILY, Disp: 30 tablet, Rfl: 5   sertraline  (ZOLOFT ) 50 MG tablet, Take 1 tablet (50 mg total) by mouth daily., Disp: 30 tablet, Rfl: 5   sucralfate  (CARAFATE ) 1 g tablet, Take 1 tablet (1 g total) by mouth 4 (four) times daily -  with meals and at bedtime., Disp: 120 tablet, Rfl: 0   tadalafil  (CIALIS ) 20 MG tablet, Take 0.5-1 tablets (10-20 mg total) by mouth every other day as needed for erectile dysfunction., Disp: 16 tablet, Rfl: 11  Social History: Social History   Tobacco Use   Smoking status: Former    Current packs/day: 0.00    Types: Cigarettes    Quit date: 09/06/2019    Years since quitting: 4.7   Smokeless tobacco: Never  Vaping Use   Vaping status: Never Used  Substance Use Topics   Drug use: Never    Family Medical History: No family history on file.  Physical Examination: There were no vitals filed for this visit.  General: Patient is in no apparent distress. Attention to examination is appropriate.  Neck:   Supple.  Full range of motion.  Respiratory: Patient is breathing without any difficulty.   NEUROLOGICAL:     Awake, alert, oriented to person, place, and  time.  Speech is clear and fluent.   Cranial Nerves: Pupils equal round and reactive to light.  Facial tone is symmetric.  Facial sensation is symmetric. Shoulder shrug is symmetric. Tongue protrusion is midline.    Strength: Side Biceps Triceps Deltoid Interossei Grip Wrist Ext. Wrist Flex.  R 5 5 5 5 5 5 5   L 5 5 5 5 5 5 5    Side Iliopsoas Quads Hamstring PF DF EHL  R 5 5 5 5 5 5   L 5 5 5 5 5 5    Reflexes are ***2+  and symmetric at the biceps, triceps, brachioradialis, patella and achilles.   Hoffman's is absent. Clonus is absent  Bilateral upper and lower extremity sensation is intact to light touch ***.     No evidence of dysmetria noted.  Gait is normal.    Imaging: *** I have personally reviewed the images and agree with the above interpretation.  Medical Decision Making/Assessment and Plan: Mr. Bagnell is a pleasant 48 y.o. male with ***  There are no diagnoses linked to this encounter.   Thank you for involving me in the care of this patient.    Penne MICAEL Sharps MD/MSCR Neurosurgery

## 2024-06-08 ENCOUNTER — Ambulatory Visit: Admitting: Neurosurgery

## 2024-06-08 ENCOUNTER — Encounter: Payer: Self-pay | Admitting: Neurosurgery

## 2024-06-08 ENCOUNTER — Ambulatory Visit

## 2024-06-08 VITALS — BP 126/88 | Ht 72.0 in | Wt 172.4 lb

## 2024-06-08 DIAGNOSIS — R200 Anesthesia of skin: Secondary | ICD-10-CM | POA: Insufficient documentation

## 2024-06-08 DIAGNOSIS — M5412 Radiculopathy, cervical region: Secondary | ICD-10-CM | POA: Insufficient documentation

## 2024-06-08 DIAGNOSIS — R269 Unspecified abnormalities of gait and mobility: Secondary | ICD-10-CM | POA: Diagnosis not present

## 2024-06-08 DIAGNOSIS — G8929 Other chronic pain: Secondary | ICD-10-CM

## 2024-06-08 DIAGNOSIS — M5442 Lumbago with sciatica, left side: Secondary | ICD-10-CM

## 2024-06-08 DIAGNOSIS — G959 Disease of spinal cord, unspecified: Secondary | ICD-10-CM | POA: Diagnosis not present

## 2024-06-08 DIAGNOSIS — M5020 Other cervical disc displacement, unspecified cervical region: Secondary | ICD-10-CM | POA: Insufficient documentation

## 2024-06-08 DIAGNOSIS — M4802 Spinal stenosis, cervical region: Secondary | ICD-10-CM | POA: Insufficient documentation

## 2024-06-11 ENCOUNTER — Encounter: Payer: Self-pay | Admitting: Neurosurgery

## 2024-06-11 DIAGNOSIS — M5412 Radiculopathy, cervical region: Secondary | ICD-10-CM

## 2024-06-11 DIAGNOSIS — R200 Anesthesia of skin: Secondary | ICD-10-CM

## 2024-06-11 DIAGNOSIS — R269 Unspecified abnormalities of gait and mobility: Secondary | ICD-10-CM

## 2024-06-11 DIAGNOSIS — R29898 Other symptoms and signs involving the musculoskeletal system: Secondary | ICD-10-CM

## 2024-06-13 ENCOUNTER — Other Ambulatory Visit: Payer: Self-pay | Admitting: Family Medicine

## 2024-06-13 DIAGNOSIS — M5412 Radiculopathy, cervical region: Secondary | ICD-10-CM

## 2024-06-13 DIAGNOSIS — G8929 Other chronic pain: Secondary | ICD-10-CM

## 2024-06-13 NOTE — Telephone Encounter (Signed)
 Per Dr. Theressa note, he wanted to get MRI of cervical and MRI of lumbar spine. I will put in orders.

## 2024-06-14 NOTE — Addendum Note (Signed)
 Addended by: GIRARD DON GAILS on: 06/14/2024 02:16 PM   Modules accepted: Orders

## 2024-06-18 ENCOUNTER — Ambulatory Visit
Admission: RE | Admit: 2024-06-18 | Discharge: 2024-06-18 | Disposition: A | Discharge status: Home / Self Care | Source: Ambulatory Visit | Attending: Orthopedic Surgery | Admitting: Orthopedic Surgery

## 2024-06-18 ENCOUNTER — Ambulatory Visit
Admission: RE | Admit: 2024-06-18 | Discharge: 2024-06-18 | Disposition: A | Source: Ambulatory Visit | Attending: Orthopedic Surgery | Admitting: Orthopedic Surgery

## 2024-06-18 DIAGNOSIS — R202 Paresthesia of skin: Secondary | ICD-10-CM | POA: Insufficient documentation

## 2024-06-18 DIAGNOSIS — M5412 Radiculopathy, cervical region: Secondary | ICD-10-CM | POA: Diagnosis present

## 2024-06-18 DIAGNOSIS — R29898 Other symptoms and signs involving the musculoskeletal system: Secondary | ICD-10-CM | POA: Insufficient documentation

## 2024-06-18 DIAGNOSIS — R200 Anesthesia of skin: Secondary | ICD-10-CM | POA: Diagnosis present

## 2024-06-18 DIAGNOSIS — R269 Unspecified abnormalities of gait and mobility: Secondary | ICD-10-CM | POA: Insufficient documentation

## 2024-06-27 ENCOUNTER — Ambulatory Visit: Payer: Self-pay | Admitting: Neurosurgery

## 2024-06-29 ENCOUNTER — Encounter: Payer: Self-pay | Admitting: Family Medicine

## 2024-06-30 ENCOUNTER — Other Ambulatory Visit: Payer: Self-pay | Admitting: Family Medicine

## 2024-06-30 MED ORDER — HYDROCODONE-ACETAMINOPHEN 10-325 MG PO TABS: ORAL_TABLET | ORAL | 0 refills | Status: AC

## 2024-07-02 ENCOUNTER — Other Ambulatory Visit

## 2024-07-05 ENCOUNTER — Ambulatory Visit: Payer: Self-pay | Admitting: Neurosurgery

## 2024-07-07 NOTE — Progress Notes (Unsigned)
" ° °  Telephone Visit- Progress Note: Referring Physician:  Alphonsa Glendia LABOR, MD [address] [place],  [place] [address]  Primary Physician:  Alphonsa Glendia LABOR, MD  This visit was performed via telephone.  Patient location: home Provider location: office  I spent a total of *** minutes non-face-to-face activities for this visit on the date of this encounter including review of current clinical condition and response to treatment.    Patient has given verbal consent to this telephone visits and we reviewed the limitations of a telephone visit. Patient wishes to proceed.    Chief Complaint:  ***  History of Present Illness: Andrew Fernandez is a 49 y.o. male has a history of ***.     Exam: No exam done as this was a telephone encounter.     Imaging: ***  I have personally reviewed the images and agree with the above interpretation.  Assessment and Plan: [name] is a pleasant 49 y.o. male with ***  Treatment options discussed with patient and following plan made:   - Order for physical therapy for *** spine ***. Patient to call to schedule appointment. *** - Continue current medications including ***. Reviewed dosing and side effects.  - Prescription for ***. Reviewed dosing and side effects. Take with food.  - Prescription for *** to take prn muscle spasms. Reviewed dosing and side effects. Discussed this can cause drowsiness.  - MRI of *** to further evaluate *** radiculopathy. No improvement time or medications (***).  - Referral to PMR at [facility] to discuss possible *** injections.  - Will schedule phone visit to review MRI results once I get them back.   Glade Boys PA-C Neurosurgery "

## 2024-07-11 ENCOUNTER — Telehealth: Admitting: Orthopedic Surgery

## 2024-07-12 ENCOUNTER — Ambulatory Visit: Admitting: Neurosurgery

## 2024-07-12 DIAGNOSIS — M5442 Lumbago with sciatica, left side: Secondary | ICD-10-CM

## 2024-07-12 DIAGNOSIS — G8929 Other chronic pain: Secondary | ICD-10-CM | POA: Diagnosis not present

## 2024-07-12 DIAGNOSIS — M5412 Radiculopathy, cervical region: Secondary | ICD-10-CM | POA: Diagnosis not present

## 2024-07-12 NOTE — Progress Notes (Signed)
 "  Telephone Visit- Progress Note: Referring Physician:  Alphonsa Glendia LABOR, MD [address] [place],  [place] [address]  Primary Physician:  Alphonsa Glendia LABOR, MD  This visit was performed via telephone.    Patient location: home Provider location: office  We discussed the results of his lumbar MRI as well as his cervical MRI.  We did not see any areas of significant/severe compression or any areas that look like they needed to be intervened upon.  There was an area of myelomalacia or cord signal change which generally signifies a chronic change to the spinal cord and is often associated with long-term symptoms.  More importantly it does not appear to be continually compressed and does not appear like it we will need a intervention at this time.  Plan to continue to follow conservatively.  I spent a total of 10 minutes non-face-to-face activities for this visit on the date of this encounter including review of current clinical condition and response to treatment.   Patient has given verbal consent to this telephone visits and we reviewed the limitations of a telephone visit. Patient wishes to proceed.   MR LUMBAR SPINE WO CONTRAST Result Date: 07/01/2024 EXAM: MRI LUMBAR SPINE 06/18/2024 10:57:13 AM TECHNIQUE: Multiplanar multisequence MRI of the lumbar spine was performed without the administration of intravenous contrast. COMPARISON: MR Lumbar Spine 03/31/2024. CLINICAL HISTORY: Left leg weakness. Low back pain radiating down the backs of both legs with numbness and tingling. FINDINGS: BONES AND ALIGNMENT: Normal alignment. No fracture or significant marrow edema. 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 SPINAL CORD: The conus medullaris terminates at L1 and is normal in signal. SOFT TISSUES: No paraspinal mass. DISC LEVELS: T12-L1: Only imaged sagittally and negative. L1-L2: Negative. L2-L3: Normal disc. Mild  facet hypertrophy without stenosis, unchanged. L3-L4: Minimal disc bulging and mild facet hypertrophy without stenosis, unchanged. L4-L5: Mild disc bulging and mild facet hypertrophy without stenosis, unchanged. L5-S1: Disc desiccation. Small central disc protrusion and mild facet hypertrophy without stenosis, unchanged. IMPRESSION: 1. Unchanged mild lumbar spondylosis and facet hypertrophy without stenosis. Electronically signed by: Dasie Hamburg MD MD 07/01/2024 01:42 PM EST RP Workstation: [id number]   MR CERVICAL SPINE WO CONTRAST Result Date: 06/29/2024 CLINICAL DATA:  Initial evaluation for chronic myelopathy. EXAM: MRI CERVICAL SPINE WITHOUT CONTRAST TECHNIQUE: Multiplanar, multisequence MR imaging of the cervical spine was performed. No intravenous contrast was administered. COMPARISON:  Prior MRI from 01/16/2020. FINDINGS: Alignment: Straightening of the normal cervical lordosis. No listhesis. Vertebrae: Vertebral body height maintained without acute or chronic fracture. Postoperative changes from prior ACDF at C4-5 and C6-7. Bone marrow signal intensity overall within normal limits. No worrisome osseous lesions. Mild reactive marrow edema present about the right C7-T1 facet due to facet arthritis (series 18, image 3). Cord: Subtle signal abnormality seen involving the left greater than right cord at the level of C4-5 (series 19, image 18). Subtle cord volume loss at this level, consistent with chronic myelomalacia. Otherwise normal signal morphology. Posterior Fossa, vertebral arteries, paraspinal tissues: Unremarkable. Disc levels: C2-C3: Small central disc protrusion indents the ventral thecal sac. Mild bilateral facet hypertrophy. Mild spinal stenosis. Foramina remain patent. C3-C4: Degenerative intervertebral disc space narrowing with diffuse disc bulge and bilateral uncovertebral spurring. Mild left greater than right facet hypertrophy. No significant spinal stenosis. Foramina remain patent. C4-C5:  Prior fusion. No residual spinal stenosis. Mild bilateral facet hypertrophy with left-sided uncovertebral spurring with residual mild left  C5 foraminal narrowing. Right neural foramen remains patent. C5-C6: Degenerative disc space narrowing with diffuse disc osteophyte complex. No spinal stenosis. Right-sided uncovertebral spurring with mild right C6 foraminal narrowing. Left neural foramen remains patent. C6-C7: Prior fusion. No residual spinal stenosis. Foramina appear adequately patent. C7-T1: Normal interspace. Moderate right facet arthrosis with mild reactive marrow edema. No spinal stenosis. Foramina remain patent. IMPRESSION: 1. Prior ACDF at C4-5 and C6-7 without residual spinal stenosis. 2. Subtle signal abnormality involving the left greater than right cord at the level of C4-5, consistent with chronic myelomalacia. 3. Small central disc protrusion at C2-3 with resultant mild spinal stenosis. 4. Moderate right-sided facet arthrosis at C7-T1 with mild reactive marrow edema. Finding could serve as a source for neck pain. Electronically Signed   By: Morene Hoard M.D.   On: 06/29/2024 08:55   DG Cervical Spine Complete Result Date: 06/23/2024 EXAM: 6 OR MORE VIEW(S) XRAY OF THE CERVICAL SPINE 06/08/2024 01:48:18 PM COMPARISON: 10/23/2022 CLINICAL HISTORY: Evaluate for excess motion. FINDINGS: BONES: Vertebral body heights are maintained. Anterior cervical fusion hardware is redemonstrated at C4-C5 and C6-C7. Hardware appears intact. Similar trace degenerative retrolisthesis of C3 on C4. The alignment at C3-C4 is unchanged on extension images. Retrolisthesis at C3-C4 reduces on flexion images. DISCS AND DEGENERATIVE CHANGES: Mild disc space narrowing at C3-C4. Moderate disc space narrowing at C5-C6. Facet arthrosis at multiple levels. SOFT TISSUES: No prevertebral soft tissue swelling. The visualized lungs appear clear. IMPRESSION: 1. Trace degenerative retrolisthesis of C3 on C4, unchanged on  extension, reduces on flexion. 2. Anterior cervical fusion hardware at C4-5 and C6-7, intact. Electronically signed by: Donnice Mania MD 06/23/2024 08:49 PM EST RP Workstation: [id number]   DG Lumbar Spine Complete Result Date: 06/23/2024 EXAM: 4 VIEW(S) XRAY OF THE LUMBAR SPINE 06/08/2024 01:48:18 PM COMPARISON: MRI lumbar spine 03/31/2024. CLINICAL HISTORY: Evaluate for excess motion. FINDINGS: LUMBAR SPINE: BONES: Vertebral body heights are maintained. Trace retrolisthesis of L2 on L3 and L3 on L4. No significant shift in alignment with flexion or extension. DISCS AND DEGENERATIVE CHANGES: Intervertebral disc spaces are maintained. Facet arthrosis most pronounced at L5-S1. SOFT TISSUES: No acute abnormality. IMPRESSION: 1. No significant dynamic instability with flexion or extension. Trace retrolisthesis of L2 on L3 and L3 on L4. Electronically signed by: Donnice Mania MD 06/23/2024 08:46 PM EST RP Workstation: [id number]     "
